# Patient Record
Sex: Female | Born: 1990 | Hispanic: Yes | Marital: Married | Smoking: Never smoker
Health system: Southern US, Community
[De-identification: ages and names within clinical notes are randomized; demographics above are authoritative.]

## PROBLEM LIST (undated history)

## (undated) DIAGNOSIS — B999 Unspecified infectious disease: Secondary | ICD-10-CM

## (undated) DIAGNOSIS — F32A Depression, unspecified: Secondary | ICD-10-CM

## (undated) DIAGNOSIS — D509 Iron deficiency anemia, unspecified: Secondary | ICD-10-CM

## (undated) DIAGNOSIS — D649 Anemia, unspecified: Secondary | ICD-10-CM

## (undated) DIAGNOSIS — F329 Major depressive disorder, single episode, unspecified: Secondary | ICD-10-CM

## (undated) HISTORY — DX: Iron deficiency anemia, unspecified: D50.9

## (undated) HISTORY — PX: NO PAST SURGERIES: SHX2092

## (undated) HISTORY — PX: APPENDECTOMY: SHX54

---
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
## 2016-08-11 ENCOUNTER — Emergency Department (HOSPITAL_BASED_OUTPATIENT_CLINIC_OR_DEPARTMENT_OTHER): Payer: Self-pay

## 2016-08-11 ENCOUNTER — Encounter (HOSPITAL_BASED_OUTPATIENT_CLINIC_OR_DEPARTMENT_OTHER): Payer: Self-pay | Admitting: Emergency Medicine

## 2016-08-11 ENCOUNTER — Emergency Department (HOSPITAL_BASED_OUTPATIENT_CLINIC_OR_DEPARTMENT_OTHER)
Admission: EM | Admit: 2016-08-11 | Discharge: 2016-08-11 | Disposition: A | Discharge status: Home / Self Care | Payer: Self-pay | Attending: Emergency Medicine | Admitting: Emergency Medicine

## 2016-08-11 DIAGNOSIS — N730 Acute parametritis and pelvic cellulitis: Secondary | ICD-10-CM

## 2016-08-11 DIAGNOSIS — N739 Female pelvic inflammatory disease, unspecified: Secondary | ICD-10-CM | POA: Insufficient documentation

## 2016-08-11 DIAGNOSIS — Z91010 Allergy to peanuts: Secondary | ICD-10-CM | POA: Insufficient documentation

## 2016-08-11 HISTORY — DX: Anemia, unspecified: D64.9

## 2016-08-11 LAB — URINALYSIS, ROUTINE W REFLEX MICROSCOPIC
Bilirubin Urine: NEGATIVE
GLUCOSE, UA: NEGATIVE mg/dL
HGB URINE DIPSTICK: NEGATIVE
KETONES UR: 15 mg/dL — AB
LEUKOCYTES UA: NEGATIVE
Nitrite: NEGATIVE
PH: 5.5 (ref 5.0–8.0)
Protein, ur: NEGATIVE mg/dL
Specific Gravity, Urine: 1.019 (ref 1.005–1.030)

## 2016-08-11 LAB — CBC WITH DIFFERENTIAL/PLATELET
Basophils Absolute: 0 10*3/uL (ref 0.0–0.1)
Basophils Relative: 0 %
Eosinophils Absolute: 0 10*3/uL (ref 0.0–0.7)
Eosinophils Relative: 1 %
HEMATOCRIT: 36.2 % (ref 36.0–46.0)
Hemoglobin: 12.1 g/dL (ref 12.0–15.0)
LYMPHS ABS: 1.7 10*3/uL (ref 0.7–4.0)
Lymphocytes Relative: 49 %
MCH: 26.4 pg (ref 26.0–34.0)
MCHC: 33.4 g/dL (ref 30.0–36.0)
MCV: 78.9 fL (ref 78.0–100.0)
MONO ABS: 0.4 10*3/uL (ref 0.1–1.0)
MONOS PCT: 12 %
Neutro Abs: 1.3 10*3/uL — ABNORMAL LOW (ref 1.7–7.7)
Neutrophils Relative %: 38 %
Platelets: 219 10*3/uL (ref 150–400)
RBC: 4.59 MIL/uL (ref 3.87–5.11)
RDW: 15.8 % — ABNORMAL HIGH (ref 11.5–15.5)
WBC: 3.4 10*3/uL — ABNORMAL LOW (ref 4.0–10.5)

## 2016-08-11 LAB — COMPREHENSIVE METABOLIC PANEL
ALK PHOS: 52 U/L (ref 38–126)
ALT: 13 U/L — ABNORMAL LOW (ref 14–54)
ANION GAP: 8 (ref 5–15)
AST: 22 U/L (ref 15–41)
Albumin: 4.7 g/dL (ref 3.5–5.0)
BILIRUBIN TOTAL: 0.8 mg/dL (ref 0.3–1.2)
BUN: 8 mg/dL (ref 6–20)
CALCIUM: 9.4 mg/dL (ref 8.9–10.3)
CO2: 23 mmol/L (ref 22–32)
Chloride: 106 mmol/L (ref 101–111)
Creatinine, Ser: 0.54 mg/dL (ref 0.44–1.00)
GFR calc non Af Amer: >60 mL/min (ref >60)
Glucose, Bld: 68 mg/dL (ref 65–99)
Potassium: 3.2 mmol/L — ABNORMAL LOW (ref 3.5–5.1)
Sodium: 137 mmol/L (ref 135–145)
TOTAL PROTEIN: 7.8 g/dL (ref 6.5–8.1)

## 2016-08-11 LAB — WET PREP, GENITAL
Clue Cells Wet Prep HPF POC: NONE SEEN
Sperm: NONE SEEN
Trich, Wet Prep: NONE SEEN
WBC, Wet Prep HPF POC: NONE SEEN
YEAST WET PREP: NONE SEEN

## 2016-08-11 LAB — PREGNANCY, URINE: Preg Test, Ur: NEGATIVE

## 2016-08-11 LAB — LIPASE, BLOOD: LIPASE: 34 U/L (ref 11–51)

## 2016-08-11 MED ORDER — DOXYCYCLINE HYCLATE 100 MG PO CAPS: 100.0000 mg | ORAL_CAPSULE | Freq: Two times a day (BID) | ORAL | 0 refills | Status: AC

## 2016-08-11 MED ORDER — KETOROLAC TROMETHAMINE 30 MG/ML IJ SOLN
15.0000 mg | Freq: Once | INTRAMUSCULAR | Status: AC
  Administered 2016-08-11: 15 mg via INTRAVENOUS
  Filled 2016-08-11: qty 1

## 2016-08-11 MED ORDER — GI COCKTAIL ~~LOC~~
30.0000 mL | Freq: Once | ORAL | Status: AC
  Administered 2016-08-11: 30 mL via ORAL
  Filled 2016-08-11: qty 30

## 2016-08-11 MED ORDER — ONDANSETRON 4 MG PO TBDP
4.0000 mg | ORAL_TABLET | Freq: Once | ORAL | Status: DC
Start: 2016-08-11 — End: 2016-08-11

## 2016-08-11 MED ORDER — AZITHROMYCIN 250 MG PO TABS
1000.0000 mg | ORAL_TABLET | Freq: Once | ORAL | Status: AC
  Administered 2016-08-11: 1000 mg via ORAL
  Filled 2016-08-11: qty 4

## 2016-08-11 MED ORDER — MORPHINE SULFATE (PF) 4 MG/ML IV SOLN
4.0000 mg | Freq: Once | INTRAVENOUS | Status: AC
  Administered 2016-08-11: 4 mg via INTRAVENOUS
  Filled 2016-08-11 (×2): qty 1

## 2016-08-11 MED ORDER — ONDANSETRON HCL 4 MG/2ML IJ SOLN
4.0000 mg | Freq: Once | INTRAMUSCULAR | Status: AC
  Administered 2016-08-11: 4 mg via INTRAVENOUS
  Filled 2016-08-11: qty 2

## 2016-08-11 MED ORDER — DOXYCYCLINE HYCLATE 100 MG PO CAPS: 100.0000 mg | ORAL_CAPSULE | Freq: Two times a day (BID) | ORAL | 0 refills | Status: DC

## 2016-08-11 MED ORDER — KETOROLAC TROMETHAMINE 60 MG/2ML IM SOLN: 15.0000 mg | Freq: Once | INTRAMUSCULAR | Status: DC

## 2016-08-11 MED ORDER — ACETAMINOPHEN 500 MG PO TABS
1000.0000 mg | ORAL_TABLET | Freq: Once | ORAL | Status: AC
  Administered 2016-08-11: 1000 mg via ORAL
  Filled 2016-08-11: qty 2

## 2016-08-11 MED ORDER — CEFTRIAXONE SODIUM 500 MG IJ SOLR
250.0000 mg | Freq: Once | INTRAMUSCULAR | Status: AC
  Administered 2016-08-11: 250 mg via INTRAVENOUS
  Filled 2016-08-11: qty 250

## 2016-08-11 MED ORDER — CEFTRIAXONE SODIUM 250 MG IJ SOLR
INTRAMUSCULAR | Status: AC
  Administered 2016-08-11: 16:00:00
  Filled 2016-08-11: qty 250

## 2016-08-11 NOTE — ED Provider Notes (Signed)
[facility]   CSN: [id number] Arrival date & time: 08/11/16  1319     History   Chief Complaint Chief Complaint  Patient presents with  . Flank Pain    HPI Lacey MulliganJosephlyne Davtyan is a 26 y.o. female.  26 yo F with a chief complaint of diffuse back pain. This is coming and going is a cramp. Been going on for 2 weeks. Slowly worsening. Has had pain similar to this when she had pyelonephritis. Denies fevers denies vomiting denies diarrhea or constipation. Denies dysuria. Denies vaginal bleeding or discharge.   The history is provided by the patient.  Flank Pain  This is a new problem. The current episode started more than 1 week ago. The problem occurs constantly. The problem has not changed since onset.Associated symptoms include abdominal pain. Pertinent negatives include no chest pain, no headaches and no shortness of breath. Nothing aggravates the symptoms. Nothing relieves the symptoms. She has tried nothing for the symptoms. The treatment provided no relief.    Past Medical History:  Diagnosis Date  . Anemia     There are no active problems to display for this patient.   Past Surgical History:  Procedure Laterality Date  . APPENDECTOMY      OB History    No data available       Home Medications    Prior to Admission medications   Medication Sig Start Date End Date Taking? Authorizing Provider  doxycycline (VIBRAMYCIN) 100 MG capsule Take 1 capsule (100 mg total) by mouth 2 (two) times daily. One po bid x 7 days 08/11/16 08/25/16  Melene PlanFloyd, Dyamon Sosinski, DO    Family History No family history on file.  Social History Social History  Substance Use Topics  . Smoking status: Never Smoker  . Smokeless tobacco: Never Used  . Alcohol use No     Allergies   Peanut-containing drug products   Review of Systems Review of Systems  Constitutional: Negative for chills and fever.  HENT: Negative for congestion and rhinorrhea.   Eyes: Negative for  redness and visual disturbance.  Respiratory: Negative for shortness of breath and wheezing.   Cardiovascular: Negative for chest pain and palpitations.  Gastrointestinal: Positive for abdominal pain. Negative for constipation, diarrhea, nausea and vomiting.  Genitourinary: Positive for flank pain. Negative for dysuria, pelvic pain, urgency, vaginal bleeding and vaginal discharge.  Musculoskeletal: Negative for arthralgias and myalgias.  Skin: Negative for pallor and wound.  Neurological: Negative for dizziness and headaches.     Physical Exam Updated Vital Signs BP 132/74 (BP Location: Left Arm)   Pulse 84   Temp 98.6 F (37 C) (Oral)   Resp 20   Wt 41.7 kg (92 lb)   LMP 08/09/2016 (Exact Date)   SpO2 98%   Physical Exam  Constitutional: She is oriented to person, place, and time. She appears well-developed and well-nourished. No distress.  HENT:  Head: Normocephalic and atraumatic.  Eyes: EOM are normal. Pupils are equal, round, and reactive to light.  Neck: Normal range of motion. Neck supple.  Cardiovascular: Normal rate and regular rhythm.  Exam reveals no gallop and no friction rub.   No murmur heard. Pulmonary/Chest: Effort normal. She has no wheezes. She has no rales.  Abdominal: Soft. She exhibits no distension and no mass. There is tenderness (diffuse). There is no guarding.  Genitourinary: Cervix exhibits motion tenderness and discharge (mucus). Right adnexum displays tenderness. Right adnexum displays no mass and no fullness. Left adnexum displays no  mass, no tenderness and no fullness.  Musculoskeletal: She exhibits no edema or tenderness.  Neurological: She is alert and oriented to person, place, and time.  Skin: Skin is warm and dry. She is not diaphoretic.  Psychiatric: She has a normal mood and affect. Her behavior is normal.  Nursing note and vitals reviewed.    ED Treatments / Results  Labs (all labs ordered are listed, but only abnormal results are  displayed) Labs Reviewed  URINALYSIS, ROUTINE W REFLEX MICROSCOPIC - Abnormal; Notable for the following:       Result Value   Ketones, ur 15 (*)    All other components within normal limits  CBC WITH DIFFERENTIAL/PLATELET - Abnormal; Notable for the following:    WBC 3.4 (*)    RDW 15.8 (*)    Neutro Abs 1.3 (*)    All other components within normal limits  COMPREHENSIVE METABOLIC PANEL - Abnormal; Notable for the following:    Potassium 3.2 (*)    ALT 13 (*)    All other components within normal limits  WET PREP, GENITAL  PREGNANCY, URINE  LIPASE, BLOOD  RPR  HIV ANTIBODY (ROUTINE TESTING)  GC/CHLAMYDIA PROBE AMP ([place]) NOT AT [facility]    EKG  EKG Interpretation None       Radiology No results found.  Procedures Procedures (including critical care time)  Medications Ordered in ED Medications  cefTRIAXone (ROCEPHIN) 250 mg in dextrose 5 % 50 mL IVPB (250 mg Intravenous New Bag/Given 08/11/16 1544)  acetaminophen (TYLENOL) tablet 1,000 mg (1,000 mg Oral Given 08/11/16 1401)  gi cocktail (Maalox,Lidocaine,Donnatal) (30 mLs Oral Given 08/11/16 1401)  ondansetron (ZOFRAN) injection 4 mg (4 mg Intravenous Given 08/11/16 1358)  ketorolac (TORADOL) 30 MG/ML injection 15 mg (15 mg Intravenous Given 08/11/16 1358)  morphine 4 MG/ML injection 4 mg (4 mg Intravenous Given 08/11/16 1544)  azithromycin (ZITHROMAX) tablet 1,000 mg (1,000 mg Oral Given 08/11/16 1544)  cefTRIAXone (ROCEPHIN) 250 MG injection (  Given 08/11/16 1552)     Initial Impression / Assessment and Plan / ED Course  I have reviewed the triage vital signs and the nursing notes.  Pertinent labs & imaging results that were available during my care of the patient were reviewed by me and considered in my medical decision making (see chart for details).     26 yo F With a chief complaint of 2 weeks of diffuse abdominal and back pain. Colicky in nature. No focal abdominal tenderness. No significant CVA tenderness.  UA is negative for infection. Will obtain labs. Duration of symptoms I feel a surgical cause of this is unlikely.   The patient on pelvic exam had cervical motion tenderness as well as some right adnexal pain. With her prolonged course of symptoms though obtain an ultrasound to evaluate for possible tubo-ovarian abscess.  The patients results and plan were reviewed and discussed.   Any x-rays performed were independently reviewed by myself.   Differential diagnosis were considered with the presenting HPI.  Medications  cefTRIAXone (ROCEPHIN) 250 mg in dextrose 5 % 50 mL IVPB (250 mg Intravenous New Bag/Given 08/11/16 1544)  acetaminophen (TYLENOL) tablet 1,000 mg (1,000 mg Oral Given 08/11/16 1401)  gi cocktail (Maalox,Lidocaine,Donnatal) (30 mLs Oral Given 08/11/16 1401)  ondansetron (ZOFRAN) injection 4 mg (4 mg Intravenous Given 08/11/16 1358)  ketorolac (TORADOL) 30 MG/ML injection 15 mg (15 mg Intravenous Given 08/11/16 1358)  morphine 4 MG/ML injection 4 mg (4 mg Intravenous Given 08/11/16 1544)  azithromycin (ZITHROMAX) tablet  1,000 mg (1,000 mg Oral Given 08/11/16 1544)  cefTRIAXone (ROCEPHIN) 250 MG injection (  Given 08/11/16 1552)    Vitals:   08/11/16 1324  BP: 132/74  Pulse: 84  Resp: 20  Temp: 98.6 F (37 C)  TempSrc: Oral  SpO2: 98%  Weight: 41.7 kg (92 lb)    Final diagnoses:  PID (acute pelvic inflammatory disease)     Final Clinical Impressions(s) / ED Diagnoses   Final diagnoses:  PID (acute pelvic inflammatory disease)    New Prescriptions Current Discharge Medication List    START taking these medications   Details  doxycycline (VIBRAMYCIN) 100 MG capsule Take 1 capsule (100 mg total) by mouth 2 (two) times daily. One po bid x 7 days Qty: 28 capsule, Refills: 0         Melene Plan, DO 08/11/16 1553

## 2016-08-11 NOTE — Discharge Instructions (Signed)
Try a miralax cleanout as this is also a possible cause of prolonged crampy abdominal pain.  A scoop in 8oz of water every day until you have significant bowel movements then stop.

## 2016-08-11 NOTE — ED Triage Notes (Signed)
Pt tearful at triage with bilateral flank pain. States she noticed it two weeks ago but pain continues to increase. Denies dysuria, fevers has intermettent nausea.

## 2016-08-11 NOTE — ED Notes (Signed)
ED Provider at bedside. 

## 2016-08-11 NOTE — ED Provider Notes (Signed)
Blood pressure 132/74, pulse 84, temperature 98.6 F (37 C), temperature source Oral, resp. rate 20, weight 41.7 kg (92 lb), last menstrual period 08/09/2016, SpO2 98 %.  Assuming care from Dr. Adela LankFloyd.  In short, Lacey Fischer is a 26 y.o. female with a chief complaint of Flank Pain .  Refer to the original H&P for additional details.   The current plan of care is to follow US to evaluate for TOA and treat for PID with CMT on exam if negative.   04:20 PM  US with no torsion or TOA. Patient given Ceftriaxone 250 mg IM once in the ED and discharged home with Doxy for 2 weeks. She will establish with PCP in the area.   At this time, I do not feel there is any life-threatening condition present. I have reviewed and discussed all results (EKG, imaging, lab, urine as appropriate), exam findings with patient. I have reviewed nursing notes and appropriate previous records.  I feel the patient is safe to be discharged home without further emergent workup. Discussed usual and customary return precautions. Patient and family (if present) verbalize understanding and are comfortable with this plan.  Patient will follow-up with their primary care provider. If they do not have a primary care provider, information for follow-up has been provided to them. All questions have been answered.  Alona BeneJoshua Long, MD    Maia PlanLong, Joshua G, MD 08/11/16 [phone]

## 2016-08-12 LAB — GC/CHLAMYDIA PROBE AMP (~~LOC~~) NOT AT ARMC
Chlamydia: NEGATIVE
Neisseria Gonorrhea: NEGATIVE

## 2016-08-12 LAB — HIV ANTIBODY (ROUTINE TESTING W REFLEX): HIV SCREEN 4TH GENERATION: NONREACTIVE

## 2016-08-12 LAB — RPR: RPR: NONREACTIVE

## 2016-09-20 ENCOUNTER — Emergency Department (HOSPITAL_COMMUNITY)
Admission: EM | Admit: 2016-09-20 | Discharge: 2016-09-21 | Disposition: A | Discharge status: Home / Self Care | Payer: No Typology Code available for payment source | Attending: Emergency Medicine | Admitting: Emergency Medicine

## 2016-09-20 ENCOUNTER — Encounter (HOSPITAL_COMMUNITY): Payer: Self-pay | Admitting: Emergency Medicine

## 2016-09-20 DIAGNOSIS — Z91010 Allergy to peanuts: Secondary | ICD-10-CM | POA: Diagnosis not present

## 2016-09-20 DIAGNOSIS — T782XXA Anaphylactic shock, unspecified, initial encounter: Secondary | ICD-10-CM | POA: Diagnosis not present

## 2016-09-20 DIAGNOSIS — Z79899 Other long term (current) drug therapy: Secondary | ICD-10-CM | POA: Insufficient documentation

## 2016-09-20 DIAGNOSIS — T7840XA Allergy, unspecified, initial encounter: Secondary | ICD-10-CM | POA: Diagnosis present

## 2016-09-20 MED ORDER — METHYLPREDNISOLONE SODIUM SUCC 125 MG IJ SOLR
125.0000 mg | Freq: Once | INTRAMUSCULAR | Status: DC
  Filled 2016-09-20: qty 2

## 2016-09-20 MED ORDER — SODIUM CHLORIDE 0.9 % IV BOLUS (SEPSIS)
1000.0000 mL | Freq: Once | INTRAVENOUS | Status: AC
  Administered 2016-09-20: 1000 mL via INTRAVENOUS

## 2016-09-20 MED ORDER — FAMOTIDINE IN NACL 20-0.9 MG/50ML-% IV SOLN
20.0000 mg | Freq: Once | INTRAVENOUS | Status: AC
  Administered 2016-09-20: 20 mg via INTRAVENOUS
  Filled 2016-09-20: qty 50

## 2016-09-20 NOTE — ED Triage Notes (Signed)
Per EMS pt coming in with allergic reaction to nuts. Pt accidentally ate ice cream with nuts. Pt having swelling to face, mouth, and throat. Pt has bilateral wheezing upon arrival. Pt received 600mg  fluid prior to arrival. Pt was given 1 epi pen and 50mg  of Benedryl prior to EMS arrival to scene. EMS gave another epi pen and 5 albuterol, duoneb, and 125 of solumedrol. Pt denies any LOC. No hives.  18g R AC  113/67 HR 112 100% on neb treatment (wheezes still present with dry cough)

## 2016-09-20 NOTE — ED Notes (Signed)
Bed: WA17 Expected date:  Expected time:  Means of arrival:  Comments: EMS allergic reaction/epi pen and epi 0.3 mg per EMS/oral swelling after eating ice cream-nut allergy-also given solumedrol and albuterol

## 2016-09-20 NOTE — ED Provider Notes (Signed)
[facility]-EMER[facility]Y DEPT Provider Note   CSN: [id number] Arrival date & time: 09/20/16  2242     History   Chief Complaint Chief Complaint  Patient presents with  . Allergic Reaction    HPI Lacey Fischer is a 26 y.o. female.  26 year old female with a history of anemia presents to the emergency department for evaluation of allergic reaction. Patient with allergy to nuts and accidentally ate ice cream that contained nuts. She reports sensation of throat swelling as well as swelling to her face and mouth. EMS noted bilateral wheezing upon arrival. Patient had tried to self administer an EpiPen, but this was expired in 2014. EMS gave additional EpiPen as well as 50 mg Benadryl. Patient also received 125 mg Solu-Medrol and an albuterol treatment. The patient states that she is currently feeling much better. She denies any difficulty swallowing or shortness of breath currently.   The history is provided by the patient. No language interpreter was used.  Allergic Reaction    Past Medical History:  Diagnosis Date  . Anemia     There are no active problems to display for this patient.   Past Surgical History:  Procedure Laterality Date  . APPENDECTOMY      OB History    No data available       Home Medications    Prior to Admission medications   Medication Sig Start Date End Date Taking? Authorizing Provider  IRON PO Take 1 tablet by mouth daily.   Yes [provider]  EPINEPHrine (EPIPEN 2-PAK) 0.3 mg/0.3 mL IJ SOAJ injection Inject 0.3 mLs (0.3 mg total) into the muscle once as needed (for severe allergic reaction). CAll 911 immediately if you have to use this medicine 09/21/16   Antony Madura, PA-C    Family History No family history on file.  Social History Social History  Substance Use Topics  . Smoking status: Never Smoker  . Smokeless tobacco: Never Used  . Alcohol use No     Allergies   Peanut-containing drug products   Review of  Systems Review of Systems Ten systems reviewed and are negative for acute change, except as noted in the HPI.    Physical Exam Updated Vital Signs BP 108/70   Pulse 80   Temp 98.3 F (36.8 C) (Oral)   Resp 19   LMP 09/03/2016   SpO2 99%   Physical Exam  Constitutional: She is oriented to person, place, and time. She appears well-developed and well-nourished. No distress.  Calm and cooperative  HENT:  Head: Normocephalic and atraumatic.  Raspy voice. Oropharynx clear. Patient tolerating secretions without difficulty.  Eyes: Conjunctivae and EOM are normal. No scleral icterus.  Neck: Normal range of motion.  No stridor  Cardiovascular: Regular rhythm and intact distal pulses.   Tachycardia  Pulmonary/Chest: Effort normal. No respiratory distress. She has no rales.  Respirations even and unlabored.  Musculoskeletal: Normal range of motion.  Neurological: She is alert and oriented to person, place, and time. She exhibits normal muscle tone. Coordination normal.  GCS 15. Patient moving all extremities.  Skin: Skin is warm and dry. No rash noted. She is not diaphoretic. No erythema. No pallor.  Psychiatric: She has a normal mood and affect. Her behavior is normal.  Nursing note and vitals reviewed.    ED Treatments / Results  Labs (all labs ordered are listed, but only abnormal results are displayed) Labs Reviewed - No data to display  EKG  EKG Interpretation None  Radiology No results found.  Procedures Procedures (including critical care time)  Medications Ordered in ED Medications  famotidine (PEPCID) IVPB 20 mg premix (0 mg Intravenous Stopped 09/20/16 2334)  sodium chloride 0.9 % bolus 1,000 mL (0 mLs Intravenous Stopped 09/20/16 2341)     Initial Impression / Assessment and Plan / ED Course  I have reviewed the triage vital signs and the nursing notes.  Pertinent labs & imaging results that were available during my care of the patient were reviewed  by me and considered in my medical decision making (see chart for details).     26 year old female presents to the emergency department for anaphylactic reaction after accidentally eating ice cream with nuts. Patient with known allergy to nuts. She was given Solu-Medrol as well as Benadryl and an EpiPen by EMS. EMS also administered a DuoNeb. Patient states that she is feeling much better, though her voice was still raspy on initial presentation.  The patient has an monitored for 4 hours. On reassessment, she states that she is feeling much better. Voice is much clearer. She has no complaint of difficulty swallowing or shortness of breath. Vitals stable. I do not see indication for further emergent workup or monitoring. Will discharge with prescription for EpiPen. Return precautions discussed and provided. Patient discharged in stable condition.   Vitals:   09/21/16 0230 09/21/16 0245 09/21/16 0300 09/21/16 0315  BP: 114/73 108/71 106/65 108/70  Pulse: 93 (!) 107 88 80  Resp: 18 (!) 25 (!) 22 19  Temp:      TempSrc:      SpO2: 99% 100% 99% 99%    Final Clinical Impressions(s) / ED Diagnoses   Final diagnoses:  Anaphylaxis, initial encounter    New Prescriptions New Prescriptions   EPINEPHRINE (EPIPEN 2-PAK) 0.3 MG/0.3 ML IJ SOAJ INJECTION    Inject 0.3 mLs (0.3 mg total) into the muscle once as needed (for severe allergic reaction). CAll 911 immediately if you have to use this medicine     Antony MaduraHumes, Monick Rena, PA-C 09/21/16 0328    Paula LibraMolpus, John, MD 09/21/16 [phone]

## 2016-09-21 MED ORDER — EPINEPHRINE 0.3 MG/0.3ML IJ SOAJ: 0.3000 mg | Freq: Once | INTRAMUSCULAR | 1 refills | Status: AC | PRN

## 2019-01-17 IMAGING — US US TRANSVAGINAL NON-OB
1 series · 13 of 25 positions shown · non-contrast
Comparison: None.

CLINICAL DATA: Right adnexal pain for 1 week. Negative pregnancy
test.

EXAM:
TRANSABDOMINAL AND TRANSVAGINAL ULTRASOUND OF PELVIS
DOPPLER ULTRASOUND OF OVARIES
TECHNIQUE: Both transabdominal and transvaginal ultrasound examinations of the
pelvis were performed. Transabdominal technique was performed for
global imaging of the pelvis including uterus, ovaries, adnexal
regions, and pelvic cul-de-sac.
It was necessary to proceed with endovaginal exam following the
transabdominal exam to visualize the uterus and ovaries in better
detail. Color and duplex Doppler ultrasound was utilized to evaluate
blood flow to the ovaries.

[Series 1: us transvaginal non-ob · 0.18mm/px · 13 of 86 slices shown]
[im 1/86]
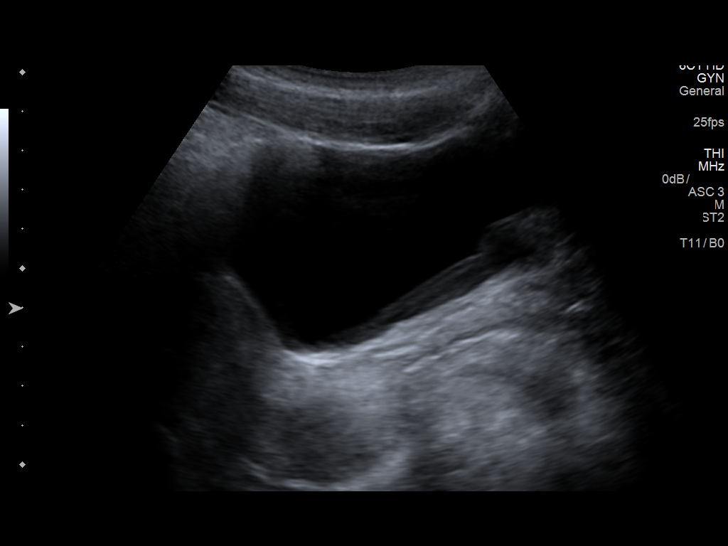
[im 8/86]
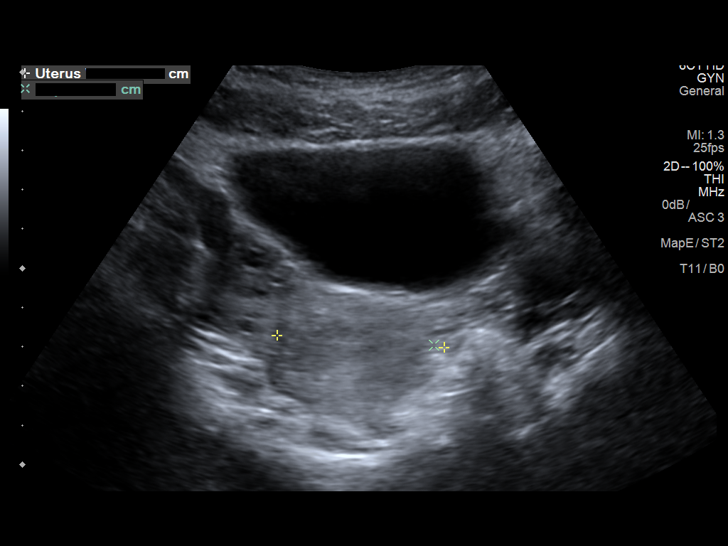
[im 15/86]
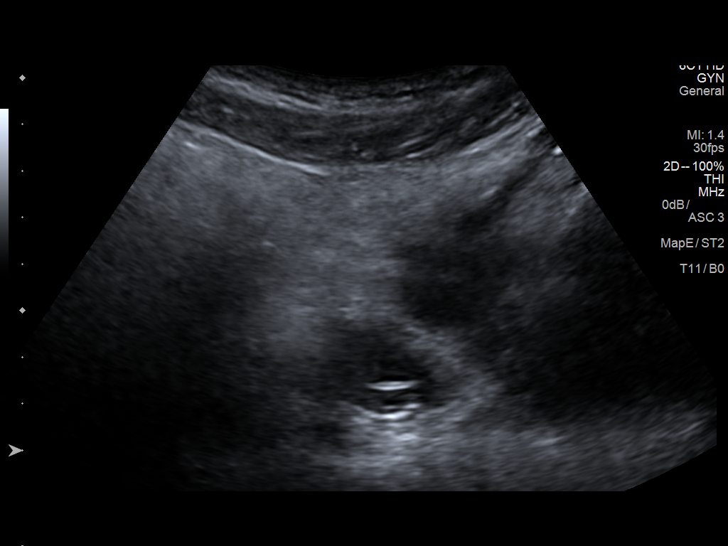
[im 22/86]
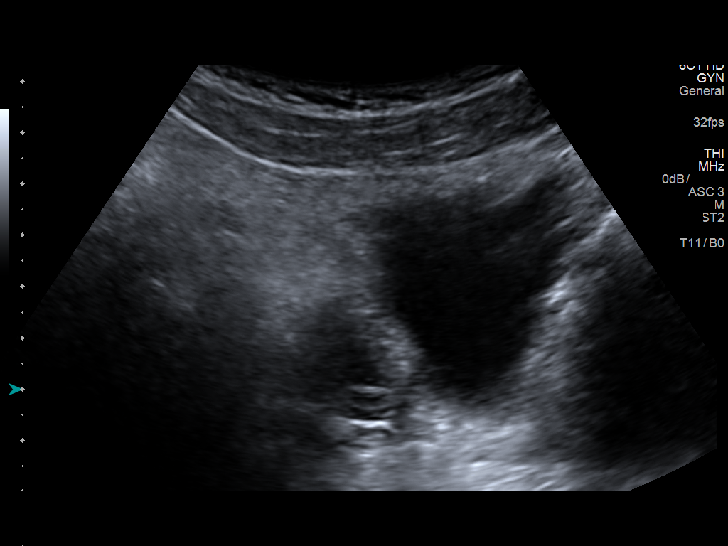
[im 29/86]
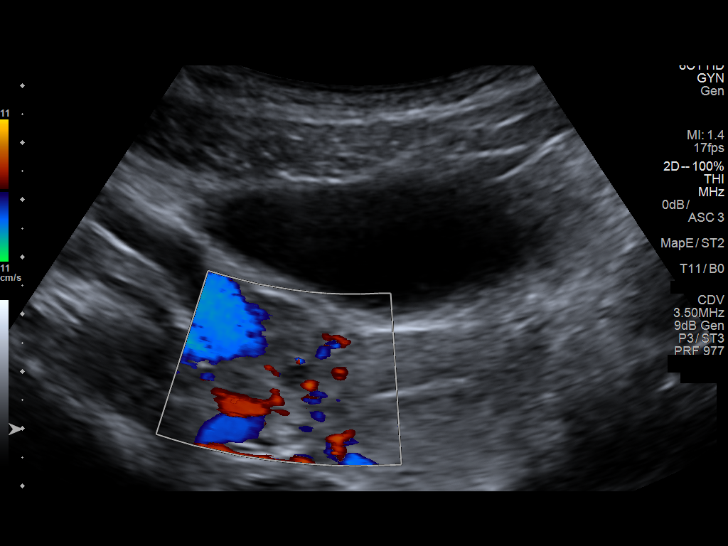
[im 36/86]
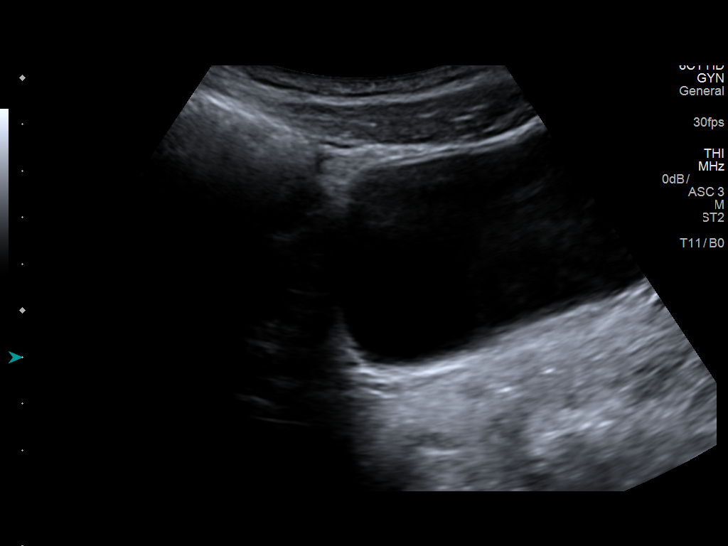
[im 43/86]
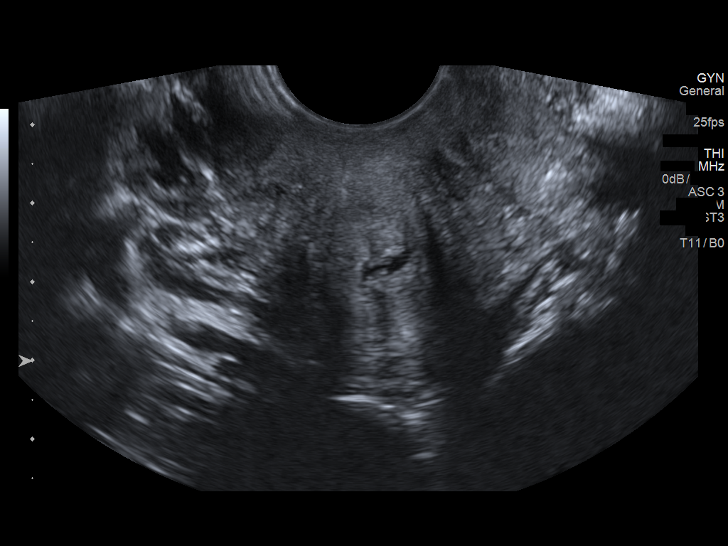
[im 50/86]
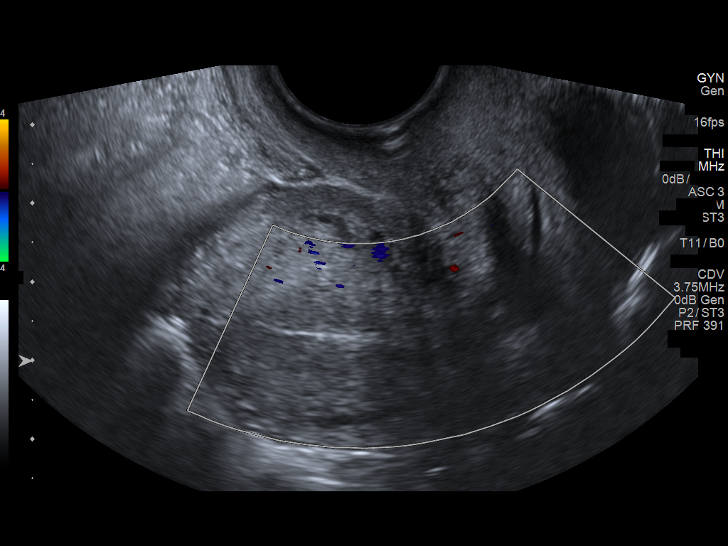
[im 57/86]
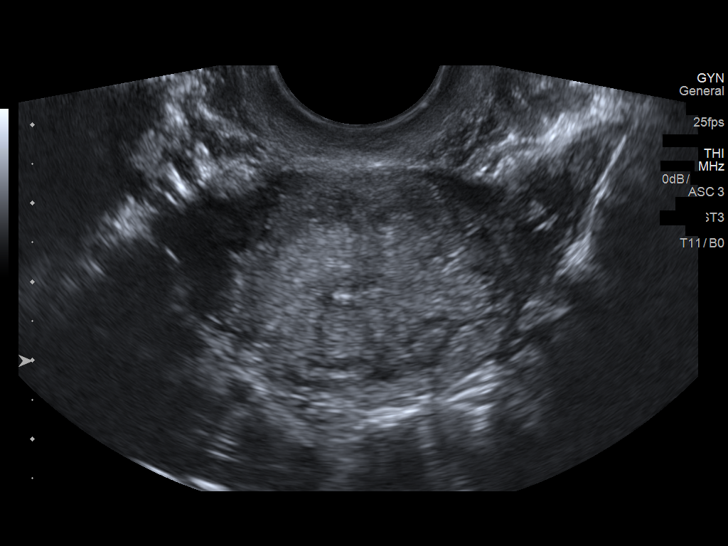
[im 64/86]
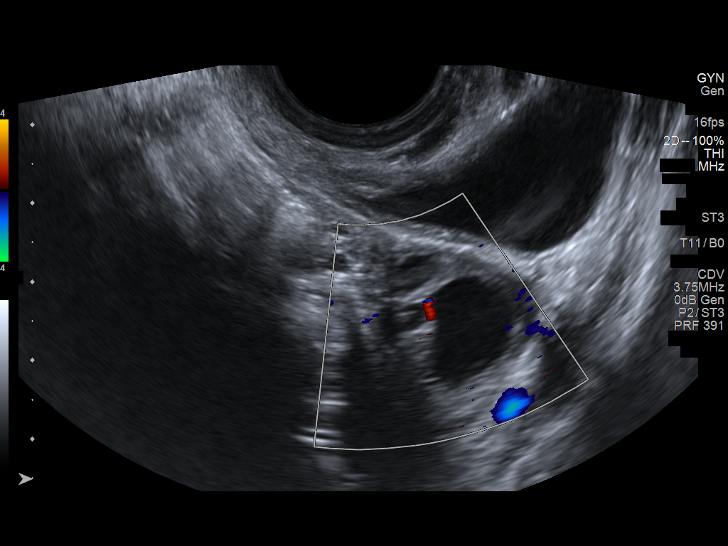
[im 71/86]
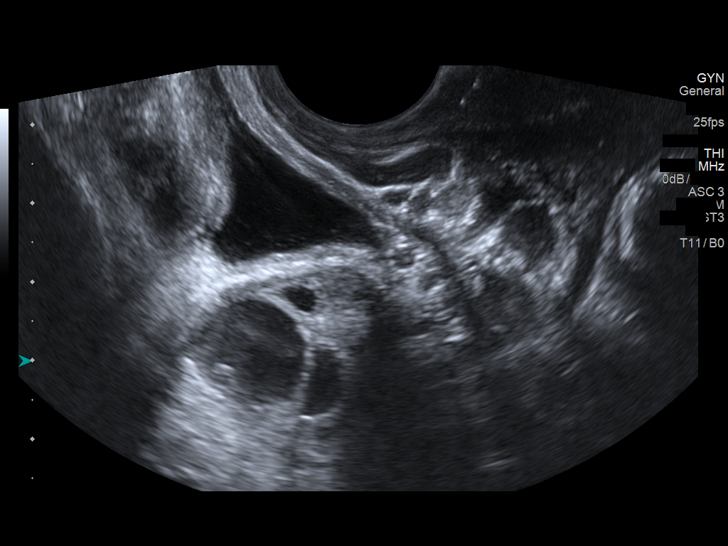
[im 78/86]
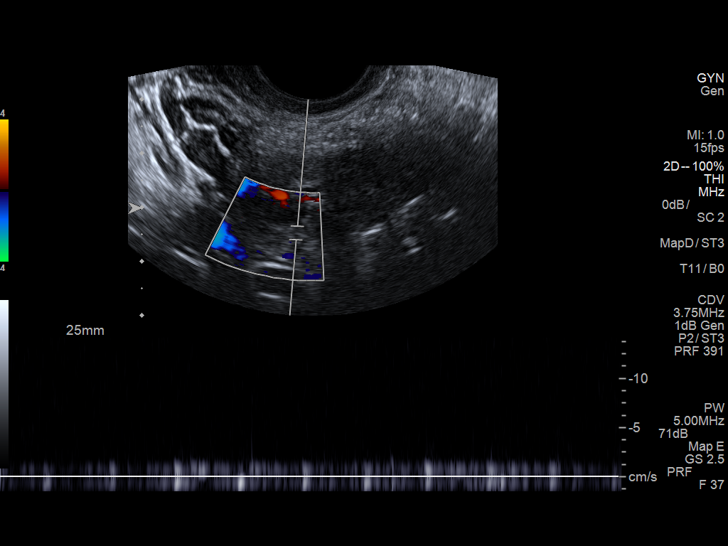
[im 86/86]
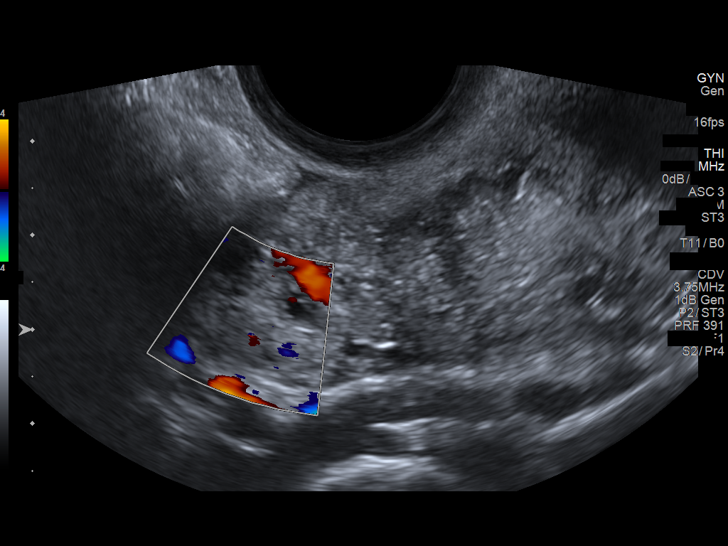

[13 of 25 positions shown; findings below may reference images not displayed]

FINDINGS: Uterus

Measurements: 6.7 x 3.9 x 3.4 cm. No fibroids or other mass
visualized.

Endometrium

Thickness: 6.8 mm.  No focal abnormality visualized.

Right ovary

Measurements: 2.5 x 1.6 x 1.4 cm. Normal appearance/no adnexal mass.

Left ovary

Measurements: 2.9 x 2.4 x 2.0 cm. Normal appearance/no adnexal mass.

Pulsed Doppler evaluation of both ovaries demonstrates normal
low-resistance arterial and venous waveforms.

Other findings

No abnormal free fluid.
IMPRESSION: Normal examination. No evidence of ovarian torsion or tubo-ovarian
abscess.

## 2019-05-01 ENCOUNTER — Ambulatory Visit: Payer: Self-pay

## 2019-07-01 ENCOUNTER — Other Ambulatory Visit: Payer: Self-pay

## 2019-07-01 ENCOUNTER — Encounter (HOSPITAL_COMMUNITY): Payer: Self-pay | Admitting: Obstetrics & Gynecology

## 2019-07-01 ENCOUNTER — Inpatient Hospital Stay (HOSPITAL_COMMUNITY)
Admission: AD | Admit: 2019-07-01 | Discharge: 2019-07-01 | Disposition: A | Discharge status: Home / Self Care | Payer: Medicaid Other | Attending: Obstetrics & Gynecology | Admitting: Obstetrics & Gynecology

## 2019-07-01 DIAGNOSIS — O210 Mild hyperemesis gravidarum: Secondary | ICD-10-CM | POA: Diagnosis present

## 2019-07-01 DIAGNOSIS — Z3A10 10 weeks gestation of pregnancy: Secondary | ICD-10-CM | POA: Diagnosis not present

## 2019-07-01 HISTORY — DX: Unspecified infectious disease: B99.9

## 2019-07-01 HISTORY — DX: Depression, unspecified: F32.A

## 2019-07-01 LAB — COMPREHENSIVE METABOLIC PANEL
ALT: 18 U/L (ref 0–44)
AST: 22 U/L (ref 15–41)
Albumin: 4.1 g/dL (ref 3.5–5.0)
Alkaline Phosphatase: 52 U/L (ref 38–126)
Anion gap: 11 (ref 5–15)
BUN: 10 mg/dL (ref 6–20)
CO2: 22 mmol/L (ref 22–32)
Calcium: 9.1 mg/dL (ref 8.9–10.3)
Chloride: 103 mmol/L (ref 98–111)
Creatinine, Ser: 0.44 mg/dL (ref 0.44–1.00)
GFR calc Af Amer: >60 mL/min (ref >60)
GFR calc non Af Amer: >60 mL/min (ref >60)
Glucose, Bld: 89 mg/dL (ref 70–99)
Potassium: 3.6 mmol/L (ref 3.5–5.1)
Sodium: 136 mmol/L (ref 135–145)
Total Bilirubin: 0.7 mg/dL (ref 0.3–1.2)
Total Protein: 7.2 g/dL (ref 6.5–8.1)

## 2019-07-01 LAB — URINALYSIS, ROUTINE W REFLEX MICROSCOPIC
Bilirubin Urine: NEGATIVE
Glucose, UA: NEGATIVE mg/dL
Hgb urine dipstick: NEGATIVE
Ketones, ur: NEGATIVE mg/dL
Leukocytes,Ua: NEGATIVE
Nitrite: NEGATIVE
Protein, ur: NEGATIVE mg/dL
Specific Gravity, Urine: 1.026 (ref 1.005–1.030)
pH: 5 (ref 5.0–8.0)

## 2019-07-01 MED ORDER — ONDANSETRON 4 MG PO TBDP
8.0000 mg | ORAL_TABLET | Freq: Once | ORAL | Status: AC
  Administered 2019-07-01: 12:00:00 8 mg via ORAL
  Filled 2019-07-01: qty 2

## 2019-07-01 MED ORDER — LACTATED RINGERS IV BOLUS
1000.0000 mL | Freq: Once | INTRAVENOUS | Status: AC
  Administered 2019-07-01: 1000 mL via INTRAVENOUS

## 2019-07-01 MED ORDER — ONDANSETRON HCL 8 MG PO TABS
8.0000 mg | ORAL_TABLET | Freq: Three times a day (TID) | ORAL | 0 refills | Status: DC | PRN
Start: 2019-07-01 — End: 2022-06-14

## 2019-07-01 NOTE — Discharge Instructions (Signed)

## 2019-07-01 NOTE — MA[facility] Provider Note (Signed)
Patient Lacey Fischer is a 29 y.o. G2P0010  at [redacted]w[redacted]d here with complaints of nausea and vomiting and feeling dizzy. She denies vaginal bleeding, vaginal discharge, dysuria, fever, SOB. She denies any other complaints. She has some stomach cramping from throwing up.   She says she threw up once at 3 am, and then threw up again when she got to work. She ate a normal diet yesterday but could not keep food down today.   History     CSN: [id number]  Arrival date and time: 07/01/19 1013   First Provider Initiated Contact with Patient 07/01/19 1138      Chief Complaint  Patient presents with  . Abdominal Pain  . Emesis  . Nausea  . Dizziness   Emesis  This is a new problem. The current episode started yesterday. The problem occurs 2 to 4 times per day. The problem has been unchanged. The emesis has an appearance of stomach contents. There has been no fever. Associated symptoms include dizziness. Pertinent negatives include no chest pain or coughing.  Dizziness This is a new problem. The problem occurs rarely. The problem has been resolved. Associated symptoms include vomiting. Pertinent negatives include no chest pain or coughing.    OB History    Gravida  2   Para      Term      Preterm      AB  1   Living  0     SAB  1   TAB      Ectopic      Multiple      Live Births  0           Past Medical History:  Diagnosis Date  . Anemia   . Depression    in therapy, doing good  . Infection    UTI    Past Surgical History:  Procedure Laterality Date  . APPENDECTOMY    . NO PAST SURGERIES      Family History  Problem Relation Age of Onset  . Healthy Mother   . Healthy Father     Social History   Tobacco Use  . Smoking status: Never Smoker  . Smokeless tobacco: Never Used  Substance Use Topics  . Alcohol use: No  . Drug use: No    Allergies:  Allergies  Allergen Reactions  . Peanut-Containing Drug Products Anaphylaxis, Swelling and Rash     Medications Prior to Admission  Medication Sig Dispense Refill Last Dose  . prenatal vitamin w/FE, FA (PRENATAL 1 + 1) 27-1 MG TABS tablet Take 1 tablet by mouth daily at 12 noon.     Marland Kitchen EPINEPHrine (EPIPEN 2-PAK) 0.3 mg/0.3 mL IJ SOAJ injection Inject 0.3 mLs (0.3 mg total) into the muscle once as needed (for severe allergic reaction). CAll 911 immediately if you have to use this medicine 1 Device 1 More than a month at Unknown time  . IRON PO Take 1 tablet by mouth daily.       Review of Systems  Constitutional: Negative.   HENT: Negative.   Respiratory: Negative.  Negative for cough.   Cardiovascular: Negative for chest pain.  Gastrointestinal: Positive for vomiting.  Skin: Negative.   Neurological: Positive for dizziness.   Physical Exam   Blood pressure 116/72, pulse (!) 110, temperature 99.2 F (37.3 C), temperature source Oral, resp. rate 16, height 5' (1.524 m), weight 44.6 kg, last menstrual period 04/19/2019, SpO2 100 %.  Physical Exam  Constitutional: She appears well-developed.  HENT:  Head: Normocephalic.  Respiratory: Effort normal.  GI: Soft. Bowel sounds are normal.  Musculoskeletal:        General: Normal range of motion.     Cervical back: Normal range of motion.  Neurological: She is alert.  Skin: Skin is warm and dry.    MAU Course  Procedures  MDM -UA normal, no other labs done -Patient had IV fluids and Zofran, feels much better.  -FHR is 175 by doppler.   Assessment and Plan   1. Morning sickness    2. Patient stable for discharge with recommendation to take RX for Zofran PRN and try light foods.   3. Keep follow up OB visit; return to MAU if cannot keep down fluids.  Return if condition worsens or develops new ob-gyn complaint.   Mervyn Skeeters Jshaun Abernathy 07/01/2019, 1:32 PM

## 2019-07-01 NOTE — MAU Note (Addendum)
Woke up about 3 in the morning throwing up.  Got sick on the way to work and it has continued.  Started feeling really dizzy, almost passed out.  Tried to eat a little watermelon, is not tolerating anything, now stomach is cramping. [facility]

## 2019-10-29 ENCOUNTER — Other Ambulatory Visit: Payer: Self-pay

## 2020-02-24 DIAGNOSIS — F322 Major depressive disorder, single episode, severe without psychotic features: Secondary | ICD-10-CM | POA: Insufficient documentation

## 2022-05-14 DIAGNOSIS — D509 Iron deficiency anemia, unspecified: Secondary | ICD-10-CM | POA: Insufficient documentation

## 2022-05-14 DIAGNOSIS — R2240 Localized swelling, mass and lump, unspecified lower limb: Secondary | ICD-10-CM | POA: Insufficient documentation

## 2022-05-21 ENCOUNTER — Other Ambulatory Visit (HOSPITAL_BASED_OUTPATIENT_CLINIC_OR_DEPARTMENT_OTHER): Payer: Self-pay | Admitting: Nurse Practitioner

## 2022-05-21 DIAGNOSIS — R2242 Localized swelling, mass and lump, left lower limb: Secondary | ICD-10-CM

## 2022-05-23 ENCOUNTER — Ambulatory Visit (HOSPITAL_BASED_OUTPATIENT_CLINIC_OR_DEPARTMENT_OTHER)
Admission: RE | Admit: 2022-05-23 | Discharge: 2022-05-23 | Disposition: A | Discharge status: Home / Self Care | Payer: Medicaid Other | Source: Ambulatory Visit | Attending: Nurse Practitioner | Admitting: Nurse Practitioner

## 2022-05-23 DIAGNOSIS — R2242 Localized swelling, mass and lump, left lower limb: Secondary | ICD-10-CM | POA: Diagnosis present

## 2022-05-24 ENCOUNTER — Telehealth: Payer: Self-pay | Admitting: Hematology and Oncology

## 2022-05-24 NOTE — Telephone Encounter (Signed)
scheduled per 3/22 referral, pt has been called and confirmed date and time. Pt is aware of location and to arrive early for check in

## 2022-05-29 ENCOUNTER — Encounter: Payer: Self-pay | Admitting: Gastroenterology

## 2022-06-08 ENCOUNTER — Emergency Department (HOSPITAL_COMMUNITY): Payer: Self-pay

## 2022-06-08 ENCOUNTER — Encounter (HOSPITAL_COMMUNITY): Payer: Self-pay

## 2022-06-08 ENCOUNTER — Emergency Department (HOSPITAL_COMMUNITY)
Admission: EM | Admit: 2022-06-08 | Discharge: 2022-06-08 | Disposition: A | Discharge status: Home / Self Care | Payer: Self-pay | Attending: Emergency Medicine | Admitting: Emergency Medicine

## 2022-06-08 ENCOUNTER — Other Ambulatory Visit: Payer: Self-pay

## 2022-06-08 DIAGNOSIS — R109 Unspecified abdominal pain: Secondary | ICD-10-CM

## 2022-06-08 DIAGNOSIS — K279 Peptic ulcer, site unspecified, unspecified as acute or chronic, without hemorrhage or perforation: Secondary | ICD-10-CM | POA: Insufficient documentation

## 2022-06-08 DIAGNOSIS — Z91010 Allergy to peanuts: Secondary | ICD-10-CM | POA: Insufficient documentation

## 2022-06-08 LAB — CBC
HCT: 35.6 % — ABNORMAL LOW (ref 36.0–46.0)
Hemoglobin: 10.8 g/dL — ABNORMAL LOW (ref 12.0–15.0)
MCH: 22.2 pg — ABNORMAL LOW (ref 26.0–34.0)
MCHC: 30.3 g/dL (ref 30.0–36.0)
MCV: 73.3 fL — ABNORMAL LOW (ref 80.0–100.0)
Platelets: 339 10*3/uL (ref 150–400)
RBC: 4.86 MIL/uL (ref 3.87–5.11)
RDW: 17.4 % — ABNORMAL HIGH (ref 11.5–15.5)
WBC: 4.2 10*3/uL (ref 4.0–10.5)
nRBC: 0 % (ref 0.0–0.2)

## 2022-06-08 LAB — HEPATIC FUNCTION PANEL
ALT: 26 U/L (ref 0–44)
AST: 25 U/L (ref 15–41)
Albumin: 4.4 g/dL (ref 3.5–5.0)
Alkaline Phosphatase: 53 U/L (ref 38–126)
Bilirubin, Direct: <0.1 mg/dL (ref 0.0–0.2)
Total Bilirubin: 0.5 mg/dL (ref 0.3–1.2)
Total Protein: 8.2 g/dL — ABNORMAL HIGH (ref 6.5–8.1)

## 2022-06-08 LAB — BASIC METABOLIC PANEL
Anion gap: 9 (ref 5–15)
BUN: 9 mg/dL (ref 6–20)
CO2: 21 mmol/L — ABNORMAL LOW (ref 22–32)
Calcium: 9.5 mg/dL (ref 8.9–10.3)
Chloride: 106 mmol/L (ref 98–111)
Creatinine, Ser: 0.55 mg/dL (ref 0.44–1.00)
GFR, Estimated: >60 mL/min (ref >60)
Glucose, Bld: 86 mg/dL (ref 70–99)
Potassium: 3.3 mmol/L — ABNORMAL LOW (ref 3.5–5.1)
Sodium: 136 mmol/L (ref 135–145)

## 2022-06-08 LAB — LIPASE, BLOOD: Lipase: 43 U/L (ref 11–51)

## 2022-06-08 LAB — C-REACTIVE PROTEIN: CRP: 0.8 mg/dL (ref <1.0)

## 2022-06-08 LAB — I-STAT BETA HCG BLOOD, ED (MC, WL, AP ONLY): I-stat hCG, quantitative: <5 m[IU]/mL (ref <5)

## 2022-06-08 LAB — TROPONIN I (HIGH SENSITIVITY)
Troponin I (High Sensitivity): <2 ng/L (ref <18)
Troponin I (High Sensitivity): <2 ng/L (ref <18)

## 2022-06-08 LAB — SEDIMENTATION RATE: Sed Rate: 7 mm/hr (ref 0–22)

## 2022-06-08 MED ORDER — FAMOTIDINE 20 MG PO TABS: 40.0000 mg | ORAL_TABLET | Freq: Every day | ORAL | 1 refills | Status: AC

## 2022-06-08 MED ORDER — MORPHINE SULFATE (PF) 4 MG/ML IV SOLN
4.0000 mg | Freq: Once | INTRAVENOUS | Status: AC
  Administered 2022-06-08: 4 mg via INTRAVENOUS
  Filled 2022-06-08: qty 1

## 2022-06-08 MED ORDER — POTASSIUM CHLORIDE CRYS ER 20 MEQ PO TBCR
40.0000 meq | EXTENDED_RELEASE_TABLET | Freq: Once | ORAL | Status: AC
  Administered 2022-06-08: 40 meq via ORAL
  Filled 2022-06-08: qty 2

## 2022-06-08 MED ORDER — MAALOX MAX 400-400-40 MG/5ML PO SUSP: 10.0000 mL | Freq: Four times a day (QID) | ORAL | 0 refills | Status: AC | PRN

## 2022-06-08 MED ORDER — ALUM & MAG HYDROXIDE-SIMETH 200-200-20 MG/5ML PO SUSP
30.0000 mL | Freq: Once | ORAL | Status: AC
  Administered 2022-06-08: 30 mL via ORAL
  Filled 2022-06-08: qty 30

## 2022-06-08 MED ORDER — ONDANSETRON HCL 4 MG/2ML IJ SOLN
4.0000 mg | Freq: Once | INTRAMUSCULAR | Status: AC
  Administered 2022-06-08: 4 mg via INTRAVENOUS
  Filled 2022-06-08: qty 2

## 2022-06-08 MED ORDER — IOHEXOL 300 MG/ML  SOLN
100.0000 mL | Freq: Once | INTRAMUSCULAR | Status: AC | PRN
  Administered 2022-06-08: 100 mL via INTRAVENOUS

## 2022-06-08 MED ORDER — LACTATED RINGERS IV BOLUS
1000.0000 mL | Freq: Once | INTRAVENOUS | Status: AC
  Administered 2022-06-08: 1000 mL via INTRAVENOUS

## 2022-06-08 MED ORDER — FAMOTIDINE IN NACL 20-0.9 MG/50ML-% IV SOLN
20.0000 mg | Freq: Once | INTRAVENOUS | Status: AC
  Administered 2022-06-08: 20 mg via INTRAVENOUS
  Filled 2022-06-08: qty 50

## 2022-06-08 MED ORDER — LIDOCAINE VISCOUS HCL 2 % MT SOLN
15.0000 mL | Freq: Once | OROMUCOSAL | Status: AC
  Administered 2022-06-08: 15 mL via ORAL
  Filled 2022-06-08: qty 15

## 2022-06-08 NOTE — Discharge Instructions (Signed)
You were seen for your stomach pain in the emergency department. It is likely from an ulcer in your stomach.   At home, please take the Pepcid we have prescribed you to treat your ulcer.  You may use the Maalox we have prescribed you as needed as well.  Please avoid alcohol and NSAIDs such as ibuprofen as this may worsen your condition.  Check your MyChart online for the results of any tests that had not resulted by the time you left the emergency department.   Follow-up with your primary doctor in 2-3 days regarding your visit.    Return immediately to the emergency department if you experience any of the following: Severe pain, fevers, or any other concerning symptoms.    Thank you for visiting our Emergency Department. It was a pleasure taking care of you today.

## 2022-06-08 NOTE — ED Provider Notes (Signed)
Lacey Fischer Provider Note   CSN: [id number] Arrival date & time: 06/08/22  1402     History  Chief Complaint  Patient presents with   Abdominal Pain    Lacey Fischer is a 32 y.o. female.   32 year old female undergoing a workup for Crohn's disease presents emergency department with abdominal pain.  Since yesterday has had periumbilical abdominal pain.  Says that it is a cramping-like sensation that started after she had a milkshake.  Says it has been persistent and then had brunch today which made it worse.  Radiates to her back and up to her chest.  Denies any fevers, vomiting, diarrhea.  Has had some nausea and constipation.  Is not on any medications for her Crohn's disease currently.  Has had appendectomy but denies any other abdominal surgeries.  No heavy alcohol intake or history of pancreatitis.       Home Medications Prior to Admission medications   Medication Sig Start Date End Date Taking? Authorizing Provider  alum & mag hydroxide-simeth (MAALOX MAX) 400-400-40 MG/5ML suspension Take 10 mLs by mouth every 6 (six) hours as needed for indigestion. 06/08/22  Yes Rondel Baton, MD  famotidine (PEPCID) 20 MG tablet Take 2 tablets (40 mg total) by mouth at bedtime. 06/08/22 07/08/22 Yes Rondel Baton, MD  EPINEPHrine (EPIPEN 2-PAK) 0.3 mg/0.3 mL IJ SOAJ injection Inject 0.3 mLs (0.3 mg total) into the muscle once as needed (for severe allergic reaction). CAll 911 immediately if you have to use this medicine 09/21/16   Antony Madura, PA-C  IRON PO Take 1 tablet by mouth daily.    [provider]  ondansetron (ZOFRAN) 8 MG tablet Take 1 tablet (8 mg total) by mouth every 8 (eight) hours as needed for nausea or vomiting. 07/01/19   Marylene Land, CNM  prenatal vitamin w/FE, FA (PRENATAL 1 + 1) 27-1 MG TABS tablet Take 1 tablet by mouth daily at 12 noon.    [provider]      Allergies     Peanut-containing drug products    Review of Systems   Review of Systems  Physical Exam Updated Vital Signs BP 109/84 (BP Location: Right Arm)   Pulse 70   Temp 98.1 F (36.7 C) (Oral)   Resp 14   Ht 5' (1.524 m)   Wt 51.3 kg   LMP 06/08/2022 (Exact Date)   SpO2 100%   BMI 22.07 kg/m  Physical Exam Vitals and nursing note reviewed.  Constitutional:      General: She is not in acute distress.    Appearance: She is well-developed.  HENT:     Head: Normocephalic and atraumatic.     Right Ear: External ear normal.     Left Ear: External ear normal.     Nose: Nose normal.  Eyes:     Extraocular Movements: Extraocular movements intact.     Conjunctiva/sclera: Conjunctivae normal.     Pupils: Pupils are equal, round, and reactive to light.  Cardiovascular:     Rate and Rhythm: Normal rate and regular rhythm.  Pulmonary:     Effort: Pulmonary effort is normal. No respiratory distress.  Abdominal:     General: Abdomen is flat. There is no distension.     Palpations: Abdomen is soft. There is no mass.     Tenderness: There is abdominal tenderness (Periumbilical). There is no guarding.  Musculoskeletal:     Cervical back: Normal range of motion and  neck supple.     Right lower leg: No edema.     Left lower leg: No edema.  Skin:    General: Skin is warm and dry.  Neurological:     Mental Status: She is alert and oriented to person, place, and time. Mental status is at baseline.  Psychiatric:        Mood and Affect: Mood normal.     ED Results / Procedures / Treatments   Labs (all labs ordered are listed, but only abnormal results are displayed) Labs Reviewed  BASIC METABOLIC PANEL - Abnormal; Notable for the following components:      Result Value   Potassium 3.3 (*)    CO2 21 (*)    All other components within normal limits  CBC - Abnormal; Notable for the following components:   Hemoglobin 10.8 (*)    HCT 35.6 (*)    MCV 73.3 (*)    MCH 22.2 (*)    RDW 17.4  (*)    All other components within normal limits  HEPATIC FUNCTION PANEL - Abnormal; Notable for the following components:   Total Protein 8.2 (*)    All other components within normal limits  LIPASE, BLOOD  SEDIMENTATION RATE  C-REACTIVE PROTEIN  I-STAT BETA HCG BLOOD, ED ([facility], [facility], [facility] ONLY)  TROPONIN I (HIGH SENSITIVITY)  TROPONIN I (HIGH SENSITIVITY)    EKG EKG Interpretation  Date/Time:  Saturday June 08 2022 14:38:13 EDT Ventricular Rate:  81 PR Interval:  134 QRS Duration: 82 QT Interval:  419 QTC Calculation: 487 R Axis:   85 Text Interpretation: Sinus rhythm Borderline prolonged QT interval Confirmed by Vonita Moss [phone]) on 06/08/2022 4:05:42 PM  Radiology US Abdomen Limited RUQ (LIVER/GB)  Result Date: 06/08/2022 CLINICAL DATA:  [id number] Epigastric pain [id number] EXAM: ULTRASOUND ABDOMEN LIMITED RIGHT UPPER QUADRANT COMPARISON:  Same day CT. FINDINGS: Gallbladder: No gallstones or wall thickening visualized. No sonographic Murphy sign noted by sonographer. Common bile duct: Diameter: Visualized portion measures 3 mm, within normal limits. Liver: No focal lesion identified. Within normal limits in parenchymal echogenicity. Portal vein is patent on color Doppler imaging with normal direction of blood flow towards the liver. Other: None. IMPRESSION: No sonographic etiology for abdominal pain identified. Electronically Signed   By: Meda Klinefelter M.D.   On: 06/08/2022 18:52   CT ABDOMEN PELVIS W CONTRAST  Result Date: 06/08/2022 CLINICAL DATA:  Epigastric abdominal pain. History of Crohn's disease. Previous appendectomy. EXAM: CT ABDOMEN AND PELVIS WITH CONTRAST TECHNIQUE: Multidetector CT imaging of the abdomen and pelvis was performed using the standard protocol following bolus administration of intravenous contrast. RADIATION DOSE REDUCTION: This exam was performed according to the departmental dose-optimization program which includes automated exposure control, adjustment of  the mA and/or kV according to patient size and/or use of iterative reconstruction technique. CONTRAST:  OMNIPAQUE IOHEXOL 300 MG/ML  SOLN COMPARISON:  Chest radiographs obtained earlier today. FINDINGS: Lower chest: Unremarkable. Hepatobiliary: No focal liver abnormality is seen. No gallstones, gallbladder wall thickening, or biliary dilatation. Pancreas: Unremarkable. No pancreatic ductal dilatation or surrounding inflammatory changes. Spleen: Normal in size without focal abnormality. Adrenals/Urinary Tract: Small lower pole right renal cyst. This does not need imaging follow-up. Unremarkable adrenal glands, left kidney, ureters and urinary bladder. Stomach/Bowel: Unremarkable stomach, small bowel and colon. Surgically absent appendix. Vascular/Lymphatic: No significant vascular findings are present. No enlarged abdominal or pelvic lymph nodes. Reproductive: Uterus and bilateral adnexa are unremarkable. Other: No abdominal wall hernia or abnormality. No abdominopelvic  ascites. Musculoskeletal: Unremarkable bones. IMPRESSION: No significant abnormality. Electronically Signed   By: Beckie Salts M.D.   On: 06/08/2022 16:49   DG Chest 2 View  Result Date: 06/08/2022 CLINICAL DATA:  Chest and back pain for 2 days. EXAM: CHEST - 2 VIEW COMPARISON:  None Available. FINDINGS: The heart size and mediastinal contours are within normal limits. Both lungs are clear. The visualized skeletal structures are unremarkable. IMPRESSION: Normal exam. Electronically Signed   By: Danae Orleans M.D.   On: 06/08/2022 15:06    Procedures Procedures   Medications Ordered in ED Medications  lactated ringers bolus 1,000 mL (0 mLs Intravenous Stopped 06/08/22 1622)  morphine (PF) 4 MG/ML injection 4 mg (4 mg Intravenous Given 06/08/22 1522)  ondansetron (ZOFRAN) injection 4 mg (4 mg Intravenous Given 06/08/22 1521)  potassium chloride SA (KLOR-CON M) CR tablet 40 mEq (40 mEq Oral Given 06/08/22 1621)  morphine (PF) 4 MG/ML injection  4 mg (4 mg Intravenous Given 06/08/22 1643)  iohexol (OMNIPAQUE) 300 MG/ML solution 100 mL (100 mLs Intravenous Contrast Given 06/08/22 1637)  famotidine (PEPCID) IVPB 20 mg premix (0 mg Intravenous Stopped 06/08/22 1909)  alum & mag hydroxide-simeth (MAALOX/MYLANTA) 200-200-20 MG/5ML suspension 30 mL (30 mLs Oral Given 06/08/22 1832)    And  lidocaine (XYLOCAINE) 2 % viscous mouth solution 15 mL (15 mLs Oral Given 06/08/22 1832)    ED Course/ Medical Decision Making/ A&P                             Medical Decision Making Amount and/or Complexity of Data Reviewed Labs: ordered. Radiology: ordered.  Risk OTC drugs. Prescription drug management.   Aidaliz Kesterson is a 32 y.o. female with comorbidities that complicate the patient evaluation including possible Crohn's disease and appendectomy who presents emergency department with abdominal pain  Initial Ddx:  Crohn's flare, cholecystitis, biliary colic, peptic ulcer disease, gastritis, pancreatitis  MDM:  Concern the patient may have a Crohn's flare based on the location of her pain.  Appears to be postprandial also biliary colic and peptic ulcer disease also on the differential.  Will obtain CT scan to evaluate for both Crohn's flare and pancreatitis.  Plan:  Labs Lipase Pepcid Viscous lidocaine CT abdomen pelvis IV contrast  ED Summary/Re-evaluation:  Patient underwent the above workup.  Was also given some morphine for pain.  Did experience relief after the Pepcid and viscous lidocaine.  Was still having some pain when drinking water so right upper quadrant ultrasound was obtained after her CT scan was unremarkable which did not show any evidence of radiolucent gallstones.  Feel that her symptoms are likely due to peptic ulcer disease or gastritis.  Will have her follow-up with GI and her PCP and prescribe her Pepcid and Maalox in the meantime.  Of note, troponins, EKG, and chest x-ray from triage unremarkable aside from prolonged  QT.  This patient presents to the ED for concern of complaints listed in HPI, this involves an extensive number of treatment options, and is a complaint that carries with it a high risk of complications and morbidity. Disposition including potential need for admission considered.   Dispo: DC Home. Return precautions discussed including, but not limited to, those listed in the AVS. Allowed pt time to ask questions which were answered fully prior to dc.  Additional history obtained from mother Records reviewed Outpatient Clinic Notes The following labs were independently interpreted: Chemistry and show no acute abnormality  I independently reviewed the following imaging with scope of interpretation limited to determining acute life threatening conditions related to emergency care: CT Abdomen/Pelvis and agree with the radiologist interpretation with the following exceptions: none I personally reviewed and interpreted cardiac monitoring: normal sinus rhythm  I personally reviewed and interpreted the pt's EKG: see above for interpretation  I have reviewed the patients home medications and made adjustments as needed  Final Clinical Impression(s) / ED Diagnoses Final diagnoses:  Peptic ulcer disease  Abdominal pain, unspecified abdominal location    Rx / DC Orders ED Discharge Orders          Ordered    famotidine (PEPCID) 20 MG tablet  Daily at bedtime        06/08/22 2058    alum & mag hydroxide-simeth (MAALOX MAX) 400-400-40 MG/5ML suspension  Every 6 hours PRN        06/08/22 2058              Rondel BatonPaterson, Evelynne Spiers C, MD 06/08/22 2102

## 2022-06-08 NOTE — ED Triage Notes (Addendum)
Pt presents with c/o abdominal pain that radiates to her back and chest x2 days. LBM today normal in consistency. Denies any nausea or vomiting.

## 2022-06-13 ENCOUNTER — Encounter: Payer: Self-pay | Admitting: *Deleted

## 2022-06-14 ENCOUNTER — Other Ambulatory Visit (INDEPENDENT_AMBULATORY_CARE_PROVIDER_SITE_OTHER): Payer: Self-pay

## 2022-06-14 ENCOUNTER — Encounter: Payer: Self-pay | Admitting: Gastroenterology

## 2022-06-14 ENCOUNTER — Inpatient Hospital Stay: Payer: Self-pay | Attending: Hematology and Oncology

## 2022-06-14 ENCOUNTER — Inpatient Hospital Stay (HOSPITAL_BASED_OUTPATIENT_CLINIC_OR_DEPARTMENT_OTHER): Payer: Self-pay | Admitting: Hematology and Oncology

## 2022-06-14 ENCOUNTER — Encounter: Payer: Self-pay | Admitting: *Deleted

## 2022-06-14 ENCOUNTER — Encounter: Payer: Self-pay | Admitting: Hematology and Oncology

## 2022-06-14 ENCOUNTER — Ambulatory Visit (INDEPENDENT_AMBULATORY_CARE_PROVIDER_SITE_OTHER): Payer: Self-pay | Admitting: Gastroenterology

## 2022-06-14 VITALS — BP 127/87 | HR 81 | Temp 98.1°F | Resp 16 | Wt 109.9 lb

## 2022-06-14 VITALS — BP 98/68 | HR 77 | Ht 60.0 in | Wt 109.0 lb

## 2022-06-14 DIAGNOSIS — D509 Iron deficiency anemia, unspecified: Secondary | ICD-10-CM

## 2022-06-14 DIAGNOSIS — Z79899 Other long term (current) drug therapy: Secondary | ICD-10-CM | POA: Insufficient documentation

## 2022-06-14 DIAGNOSIS — N920 Excessive and frequent menstruation with regular cycle: Secondary | ICD-10-CM

## 2022-06-14 DIAGNOSIS — R1013 Epigastric pain: Secondary | ICD-10-CM | POA: Insufficient documentation

## 2022-06-14 DIAGNOSIS — G4700 Insomnia, unspecified: Secondary | ICD-10-CM | POA: Insufficient documentation

## 2022-06-14 DIAGNOSIS — D500 Iron deficiency anemia secondary to blood loss (chronic): Secondary | ICD-10-CM

## 2022-06-14 LAB — CBC WITH DIFFERENTIAL (CANCER CENTER ONLY)
Abs Immature Granulocytes: 0 10*3/uL (ref 0.00–0.07)
Basophils Absolute: 0 10*3/uL (ref 0.0–0.1)
Basophils Relative: 0 %
Eosinophils Absolute: 0.1 10*3/uL (ref 0.0–0.5)
Eosinophils Relative: 2 %
HCT: 35.1 % — ABNORMAL LOW (ref 36.0–46.0)
Hemoglobin: 11.3 g/dL — ABNORMAL LOW (ref 12.0–15.0)
Immature Granulocytes: 0 %
Lymphocytes Relative: 37 %
Lymphs Abs: 1.8 10*3/uL (ref 0.7–4.0)
MCH: 23 pg — ABNORMAL LOW (ref 26.0–34.0)
MCHC: 32.2 g/dL (ref 30.0–36.0)
MCV: 71.5 fL — ABNORMAL LOW (ref 80.0–100.0)
Monocytes Absolute: 0.4 10*3/uL (ref 0.1–1.0)
Monocytes Relative: 7 %
Neutro Abs: 2.6 10*3/uL (ref 1.7–7.7)
Neutrophils Relative %: 54 %
Platelet Count: 325 10*3/uL (ref 150–400)
RBC: 4.91 MIL/uL (ref 3.87–5.11)
RDW: 17.2 % — ABNORMAL HIGH (ref 11.5–15.5)
WBC Count: 4.8 10*3/uL (ref 4.0–10.5)
nRBC: 0 % (ref 0.0–0.2)

## 2022-06-14 LAB — CMP (CANCER CENTER ONLY)
ALT: 27 U/L (ref 0–44)
AST: 21 U/L (ref 15–41)
Albumin: 4.9 g/dL (ref 3.5–5.0)
Alkaline Phosphatase: 61 U/L (ref 38–126)
Anion gap: 8 (ref 5–15)
BUN: 12 mg/dL (ref 6–20)
CO2: 26 mmol/L (ref 22–32)
Calcium: 9.7 mg/dL (ref 8.9–10.3)
Chloride: 103 mmol/L (ref 98–111)
Creatinine: 0.66 mg/dL (ref 0.44–1.00)
GFR, Estimated: >60 mL/min (ref >60)
Glucose, Bld: 93 mg/dL (ref 70–99)
Potassium: 3.6 mmol/L (ref 3.5–5.1)
Sodium: 137 mmol/L (ref 135–145)
Total Bilirubin: 0.3 mg/dL (ref 0.3–1.2)
Total Protein: 8.8 g/dL — ABNORMAL HIGH (ref 6.5–8.1)

## 2022-06-14 LAB — H. PYLORI ANTIBODY, IGG: H Pylori IgG: NEGATIVE

## 2022-06-14 LAB — IRON AND IRON BINDING CAPACITY (CC-WL,HP ONLY)
Iron: 19 ug/dL — ABNORMAL LOW (ref 28–170)
Saturation Ratios: 3 % — ABNORMAL LOW (ref 10.4–31.8)
TIBC: 652 ug/dL — ABNORMAL HIGH (ref 250–450)
UIBC: 633 ug/dL — ABNORMAL HIGH (ref 148–442)

## 2022-06-14 LAB — RETIC PANEL
Immature Retic Fract: 12.6 % (ref 2.3–15.9)
RBC.: 5.03 MIL/uL (ref 3.87–5.11)
Retic Count, Absolute: 49.3 10*3/uL (ref 19.0–186.0)
Retic Ct Pct: 1 % (ref 0.4–3.1)
Reticulocyte Hemoglobin: 22.9 pg — ABNORMAL LOW (ref >27.9)

## 2022-06-14 LAB — SEDIMENTATION RATE: Sed Rate: 7 mm/hr (ref 0–22)

## 2022-06-14 LAB — FERRITIN: Ferritin: 2 ng/mL — ABNORMAL LOW (ref 11–307)

## 2022-06-14 LAB — C-REACTIVE PROTEIN: CRP: 0.5 mg/dL (ref <1.0)

## 2022-06-14 MED ORDER — PANTOPRAZOLE SODIUM 40 MG PO TBEC: 40.0000 mg | DELAYED_RELEASE_TABLET | Freq: Every day | ORAL | 0 refills | Status: DC

## 2022-06-14 NOTE — Progress Notes (Signed)
[facility] [phone]   [phone]  INITIAL CONSULT NOTE  Patient Care Team: Patient, No Pcp Per as PCP - General (General Practice)  Hematological/Oncological History # Iron Deficiency Anemia 2/2 to GYN Bleeding 05/14/2022: Ferritin 3, Iron saturation 3%, Hgb 10.4, MCV 73, Plt 302, WBC 5.6 06/08/2022: WBC 4.2, Hgb 10.8, MCV 73.3, Plt 339 06/14/2022: establish care with Dr. Leonides Schanz   CHIEF COMPLAINTS/PURPOSE OF CONSULTATION:  "Iron Deficiency Anemia 2/2 to GYN Bleeding/Possible Crohn's Disease"  HISTORY OF PRESENTING ILLNESS:  Lacey Fischer 32 y.o. female with medical history significant for depression and possible Crohn's disease who presents for evaluation of iron deficiency anemia.   On review of the previous records Mrs. Lagle had labs collected on 05/14/2022 which showed ferritin of 3, iron sat 3%, hemoglobin 10.4, MCV 73, platelets 302, and white blood cell count 5.6.  Most recently on 06/08/2022 patient had a hemoglobin of 10.8 with an MCV of 73.3.  Due to concern for iron deficiency anemia she was referred to hematology for further evaluation and management.  Of note there is concern in her record for possible malabsorptive disorder or Crohn's disease.  This has not yet been confirmed and she has a gastroenterology appointment today in order to further evaluate.  On exam today [name] is accompanied by her husband.  She reports that this has been a problem "for a long time".  She notes that she tries to eat vegetables rich in iron.  She reports that 6 to 7 years ago she was referred to the "sickle cell center" and was found not to have sickle cell.  She reports that he does have issues with constipation and dry skin while taking the p.o. iron pills.  She reports that there is also an issue with iron when she was pregnant.  She reports her menstrual cycles can be quite heavy and can last for up to 14 days.  She is taking birth control pills but  they are "not helping".  She has a previously she was on Implanon which was removed last year.  She does currently have suspicion of Crohn's disease based on another provider's evaluation though she is not having any bleeding, diarrhea, or stomach upset other than the constipation from the iron pills.  She reports that her mother has previously been diagnosed with Crohn's disease.  She also has been having some issues with abdominal discomfort and pain.  On further discussion she reports her mother has Crohn's disease and her father has schizophrenia, she does not have any contact with him.  She reports that she had a cousin who had a baby that had leukemia.  She has 1 healthy child.  She is never smoker but does drink alcohol about once per month.  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  She reports her primary symptoms are low energy and tiredness.  She reports that she has been sleeping poorly and having difficulty with insomnia with her weight going up and down.  She reports that she does crave ice and has not had any overt signs of bleeding anywhere.  A full 10 point ROS is otherwise negative.  MEDICAL HISTORY:  Past Medical History:  Diagnosis Date   Anemia    Depression    in therapy, doing good   IDA (iron deficiency anemia)    Infection    UTI    SURGICAL HISTORY: Past Surgical History:  Procedure Laterality Date   APPENDECTOMY     NO PAST SURGERIES  SOCIAL HISTORY: Social History   Socioeconomic History   Marital status: Married    Spouse name: Not on file   Number of children: 1   Years of education: Not on file   Highest education level: Not on file  Occupational History   Occupation: home maker  Tobacco Use   Smoking status: Never   Smokeless tobacco: Never  Vaping Use   Vaping Use: Never used  Substance and Sexual Activity   Alcohol use: Yes    Comment: 1 a day   Drug use: No   Sexual activity: Yes    Birth control/protection: Implant  Other Topics Concern    Not on file  Social History Narrative   Not on file   Social Determinants of Health   Financial Resource Strain: Not on file  Food Insecurity: Not on file  Transportation Needs: Not on file  Physical Activity: Not on file  Stress: Not on file  Social Connections: Not on file  Intimate Partner Violence: Not on file    FAMILY HISTORY: Family History  Problem Relation Age of Onset   Healthy Mother    Healthy Father    Colon cancer Neg Hx    Stomach cancer Neg Hx    Esophageal cancer Neg Hx     ALLERGIES:  is allergic to other and peanut-containing drug products.  MEDICATIONS:  Current Outpatient Medications  Medication Sig Dispense Refill   alum & mag hydroxide-simeth (MAALOX MAX) 400-400-40 MG/5ML suspension Take 10 mLs by mouth every 6 (six) hours as needed for indigestion. (Patient not taking: Reported on 06/14/2022) 355 mL 0   famotidine (PEPCID) 20 MG tablet Take 2 tablets (40 mg total) by mouth at bedtime. 30 tablet 1   IRON PO Take 1 tablet by mouth daily.     EPINEPHrine (EPIPEN 2-PAK) 0.3 mg/0.3 mL IJ SOAJ injection Inject 0.3 mLs (0.3 mg total) into the muscle once as needed (for severe allergic reaction). CAll 911 immediately if you have to use this medicine (Patient not taking: Reported on 06/14/2022) 1 Device 1   pantoprazole (PROTONIX) 40 MG tablet Take 1 tablet (40 mg total) by mouth daily. 30 tablet 0   No current facility-administered medications for this visit.    REVIEW OF SYSTEMS:   Constitutional: ( - ) fevers, ( - )  chills , ( - ) night sweats Eyes: ( - ) blurriness of vision, ( - ) double vision, ( - ) watery eyes Ears, nose, mouth, throat, and face: ( - ) mucositis, ( - ) sore throat Respiratory: ( - ) cough, ( - ) dyspnea, ( - ) wheezes Cardiovascular: ( - ) palpitation, ( - ) chest discomfort, ( - ) lower extremity swelling Gastrointestinal:  ( - ) nausea, ( - ) heartburn, ( - ) change in bowel habits Skin: ( - ) abnormal skin rashes Lymphatics:  ( - ) new lymphadenopathy, ( - ) easy bruising Neurological: ( - ) numbness, ( - ) tingling, ( - ) new weaknesses Behavioral/Psych: ( - ) mood change, ( - ) new changes  All other systems were reviewed with the patient and are negative.  PHYSICAL EXAMINATION:  Vitals:   06/14/22 0856  BP: 127/87  Pulse: 81  Resp: 16  Temp: 98.1 F (36.7 C)  SpO2: 100%   Filed Weights   06/14/22 0856  Weight: 109 lb 14.4 oz (49.9 kg)    GENERAL: well appearing young female in NAD  SKIN: skin color, texture, turgor are  normal, no rashes or significant lesions EYES: conjunctiva are pink and non-injected, sclera clear LUNGS: clear to auscultation and percussion with normal breathing effort HEART: regular rate & rhythm and no murmurs and no lower extremity edema Musculoskeletal: no cyanosis of digits and no clubbing  PSYCH: alert & oriented x 3, fluent speech NEURO: no focal motor/sensory deficits  LABORATORY DATA:  I have reviewed the data as listed    Latest Ref Rng & Units 06/14/2022    9:47 AM 06/08/2022    2:50 PM 08/11/2016    1:50 PM  CBC  WBC 4.0 - 10.5 K/uL 4.8  4.2  3.4   Hemoglobin 12.0 - 15.0 g/dL 16.1  09.6  04.5   Hematocrit 36.0 - 46.0 % 35.1  35.6  36.2   Platelets 150 - 400 K/uL 325  339  219        Latest Ref Rng & Units 06/14/2022    9:47 AM 06/08/2022    3:35 PM 06/08/2022    2:50 PM  CMP  Glucose 70 - 99 mg/dL 93   86   BUN 6 - 20 mg/dL 12   9   Creatinine 4.09 - 1.00 mg/dL 8.11   9.14   Sodium 782 - 145 mmol/L 137   136   Potassium 3.5 - 5.1 mmol/L 3.6   3.3   Chloride 98 - 111 mmol/L 103   106   CO2 22 - 32 mmol/L 26   21   Calcium 8.9 - 10.3 mg/dL 9.7   9.5   Total Protein 6.5 - 8.1 g/dL 8.8  8.2    Total Bilirubin 0.3 - 1.2 mg/dL 0.3  0.5    Alkaline Phos 38 - 126 U/L 61  53    AST 15 - 41 U/L 21  25    ALT 0 - 44 U/L 27  26       ASSESSMENT & PLAN Lacey Fischer 32 y.o. female with medical history significant for depression and possible Crohn's disease  who presents for evaluation of iron deficiency anemia.   After review of the labs, review of the records, and discussion with the patient the patients findings are most consistent with iron deficiency anemia secondary to GYN bleeding.  There is also concern for possible absorptive disorder and she is being evaluated by gastroenterology later today.  # Iron Deficiency Anemia 2/2 to GYN Bleeding # Iron Deficieincy in Setting of Possible Crohn's Disease (?) or Absorption Disorder -- Findings are consistent with iron deficiency anemia secondary to patient's menstrual bleeding in conjunction with possible Crohn's disease.  --Encouraged her to follow-up with GI, appointment later today. Continue to follow with OB/GYN for control of menstrual cycles.  --We will confirm iron deficiency anemia by ordering iron panel and ferritin as well as reticulocytes, CBC, and CMP --HOLD ferrous sulfate as this is likely not absorbing well if she has IBD.  Additionally has not effectively increased her iron and hemoglobin levels. --We will plan to proceed with IV iron therapy in order to help bolster the patient's blood counts --Additionally will order IgA and tissue transglutaminase per the request of gastroenterology. --Plan for return to clinic in 4 to 6 weeks time after last dose of IV iron   Orders Placed This Encounter  Procedures   CBC with Differential (Cancer Center Only)    Standing Status:   Future    Number of Occurrences:   1    Standing Expiration Date:   06/14/2023   CMP (  Cancer Center only)    Standing Status:   Future    Number of Occurrences:   1    Standing Expiration Date:   06/14/2023   Ferritin    Standing Status:   Future    Number of Occurrences:   1    Standing Expiration Date:   06/14/2023   Iron and Iron Binding Capacity ([facility] only)    Standing Status:   Future    Number of Occurrences:   1    Standing Expiration Date:   06/14/2023   Retic Panel    Standing Status:   Future     Number of Occurrences:   1    Standing Expiration Date:   06/14/2023   Sedimentation rate    Standing Status:   Future    Number of Occurrences:   1    Standing Expiration Date:   06/14/2023   C-reactive protein    Standing Status:   Future    Number of Occurrences:   1    Standing Expiration Date:   06/14/2023   IgA    Standing Status:   Future    Number of Occurrences:   1    Standing Expiration Date:   06/14/2023   Tissue transglutaminase, IgA    Standing Status:   Future    Number of Occurrences:   1    Standing Expiration Date:   06/14/2023    All questions were answered. The patient knows to call the clinic with any problems, questions or concerns.  A total of more than 60 minutes were spent on this encounter with face-to-face time and non-face-to-face time, including preparing to see the patient, ordering tests and/or medications, counseling the patient and coordination of care as outlined above.   Ulysees Barns, MD Department of Hematology/Oncology [facility] Phone: [phone] Pager: [phone] Email: [email or web address]  06/15/2022 4:29 PM

## 2022-06-14 NOTE — Patient Instructions (Addendum)
We have sent the following medications to your pharmacy for you to pick up at your convenience: Pantoprazole 40 mg daily  Discontinue pepcid (famotidine).  Please follow up with Doug Sou, PA-C on 08/13/22 at 2:00 pm.  Your provider has requested that you go to the basement level for lab work before leaving today. Press "B" on the elevator. The lab is located at the first door on the left as you exit the elevator.  _______________________________________________________  If your blood pressure at your visit was 140/90 or greater, please contact your primary care physician to follow up on this.  _______________________________________________________  If you are age 65 or older, your body mass index should be between 23-30. Your Body mass index is 21.29 kg/m. If this is out of the aforementioned range listed, please consider follow up with your Primary Care Provider.  If you are age 76 or younger, your body mass index should be between 19-25. Your Body mass index is 21.29 kg/m. If this is out of the aformentioned range listed, please consider follow up with your Primary Care Provider.   ________________________________________________________  The [facility] providers would like to encourage you to use [facility] to communicate with providers for non-urgent requests or questions.  Due to long hold times on the telephone, sending your provider a message by [facility] may be a faster and more efficient way to get a response.  Please allow 48 business hours for a response.  Please remember that this is for non-urgent requests.  _______________________________________________________  Due to recent changes in healthcare laws, you may see the results of your imaging and laboratory studies on MyChart before your provider has had a chance to review them.  We understand that in some cases there may be results that are confusing or concerning to you. Not all laboratory results come back in the same time  frame and the provider may be waiting for multiple results in order to interpret others.  Please give [place] 48 hours in order for your provider to thoroughly review all the results before contacting the office for clarification of your results.

## 2022-06-14 NOTE — Progress Notes (Signed)
06/14/2022 Lacey Fischer [id number] [birth year]   HISTORY OF PRESENT ILLNESS: This is a 32 year old female who is new to our office.  She is here today for evaluation of iron deficiency anemia.  She actually just saw hematology this morning as well who performed several labs including celiac labs.  Iron deficiency anemia has been longstanding.  She says that historically she does have very heavy and erratic periods.  She has been trying to work with GYN in regards to that situation.  Nonetheless, she is likely going to get some IV iron infusions.  There was some question of Crohn's disease, although I am not quite sure where that came from.  CRP and sed rate last week and then again today were normal.  She was actually in the emergency department on Saturday with complaints of upper abdominal pain.  This was new, but during that ER visit CT scan of the abdomen and pelvis with contrast was ordered and was unremarkable.  She also had a right upper quadrant ultrasound performed and that was unremarkable as well.  She says that on Friday she developed this upper abdominal pain that was burning in nature and seemed to be worse with spicy foods and citrusy based foods.  In the ER they put her on Pepcid and she has been taking that with improvement in her symptoms.  Otherwise she says that her weight tends to fluctuate.  If anything she sometimes has some constipation, but no diarrhea.  She denies any sign of GI bleeding including black or bloody stools.  Today hemoglobin is 11.3 g with a ferritin of 2, iron saturation of 3%, serum iron 19, TIBC elevated at 652.  Referred here by Charlies Silvers, NP.   Past Medical History:  Diagnosis Date   Anemia    Depression    in therapy, doing good   IDA (iron deficiency anemia)    Infection    UTI   Past Surgical History:  Procedure Laterality Date   APPENDECTOMY     NO PAST SURGERIES      reports that she has never smoked. She has never used  smokeless tobacco. She reports current alcohol use. She reports that she does not use drugs. family history includes Healthy in her father and mother. Allergies  Allergen Reactions   Other Swelling   Peanut-Containing Drug Products Anaphylaxis, Rash, Swelling and Other (See Comments)      Outpatient Encounter Medications as of 06/14/2022  Medication Sig   famotidine (PEPCID) 20 MG tablet Take 2 tablets (40 mg total) by mouth at bedtime.   IRON PO Take 1 tablet by mouth daily.   alum & mag hydroxide-simeth (MAALOX MAX) 400-400-40 MG/5ML suspension Take 10 mLs by mouth every 6 (six) hours as needed for indigestion. (Patient not taking: Reported on 06/14/2022)   EPINEPHrine (EPIPEN 2-PAK) 0.3 mg/0.3 mL IJ SOAJ injection Inject 0.3 mLs (0.3 mg total) into the muscle once as needed (for severe allergic reaction). CAll 911 immediately if you have to use this medicine (Patient not taking: Reported on 06/14/2022)   No facility-administered encounter medications on file as of 06/14/2022.     REVIEW OF SYSTEMS  : All other systems reviewed and negative except where noted in the History of Present Illness.   PHYSICAL EXAM: BP 98/68   Pulse 77   Ht 5' (1.524 m)   Wt 109 lb (49.4 kg)   LMP 06/08/2022 (Exact Date)   BMI 21.29 kg/m  General: Well developed female  in no acute distress Head: Normocephalic and atraumatic Eyes:  Sclerae anicteric, conjunctiva pink. Ears: Normal auditory acuity Lungs: Clear throughout to auscultation; no W/R/R. Heart: Regular rate and rhythm; no M/R/G. Abdomen: Soft, non-distended.  BS present.  Mild mid-epigastric TTP. Musculoskeletal: Symmetrical with no gross deformities  Skin: No lesions on visible extremities Extremities: No edema  Neurological: Alert oriented x 4, grossly non-focal Psychological:  Alert and cooperative. Normal mood and affect  ASSESSMENT AND PLAN: *32 year old female with iron deficiency anemia:  This been longstanding and likely due to  GYN bleeding.  No sign of GI bleeding.  She saw hematology this morning and is going to get IV iron infusions.  Several labs were performed including celiac labs and several are still pending. *Question of Crohn's disease: Not exactly sure where this is coming from.  Her sed rate and CRPs have been normal.  She recently had a CT scan last week that was unrevealing. *Epigastric abdominal pain: This has been present for just past week, improved on Pepcid, worse after eating eating spicy and citrusy type foods, which she does a lot of.  Will check H. pylori antibody as she is Hispanic in origin.  Will discontinue the Pepcid and will have her do pantoprazole 40 mg daily for the next several weeks.  **She will follow-up with me in 8 to 10 weeks.  **Addendum: After the patient left we found that she had an elevated Saccharomyces cerevisiae IgG, but I do not see the actual results.  I will try to obtain those results next week, but once again by inflammatory markers and recent CT imaging she has no evidence of Crohn's.  None of her just recent GI symptoms are consistent with Crohn's disease either.  ? Consider a fecal calprotectin.   CC:  Cullop, Adele Barthel, NP

## 2022-06-15 ENCOUNTER — Encounter: Payer: Self-pay | Admitting: Hematology and Oncology

## 2022-06-15 LAB — TISSUE TRANSGLUTAMINASE, IGA: Tissue Transglutaminase Ab, IgA: <2 U/mL (ref 0–3)

## 2022-06-17 ENCOUNTER — Encounter: Payer: Self-pay | Admitting: Hematology and Oncology

## 2022-06-18 LAB — IGA: IgA: 299 mg/dL (ref 87–352)

## 2022-06-18 NOTE — Progress Notes (Signed)
Agree with the assessment and plan as outlined by Doug Sou, PA-C.  Even with a positive Saccharomyces cerevisiae IgG, given the normal inflammatory markers, normal CT, and in the absence of diarrhea, do not think fecal calprotectin indicated at this juncture.  Agree with plan as outlined.  Judie Hollick, DO, [facility]

## 2022-06-21 ENCOUNTER — Telehealth: Payer: Self-pay | Admitting: Pharmacy Technician

## 2022-06-21 NOTE — Telephone Encounter (Signed)
Dr. Leonides Schanz, [place] Childes note  Auth Submission: APPROVED  FREE DRUG - PAP Site of care: Site of care: [facility] Payer: [employer] Medication & CPT/J Code(s) submitted: Monoferric (Ferrci derisomaltose) [phone] Route of submission (phone, fax, portal):  Phone # [phone] Fax # Auth type: Buy/Bill Units/visits requested: 1 Reference number: [id number] Approval from: 06/21/22 to 06/21/23

## 2022-06-26 ENCOUNTER — Encounter: Payer: Self-pay | Admitting: Hematology and Oncology

## 2022-06-27 ENCOUNTER — Ambulatory Visit (INDEPENDENT_AMBULATORY_CARE_PROVIDER_SITE_OTHER): Payer: Self-pay

## 2022-06-27 VITALS — BP 112/68 | HR 72 | Temp 98.2°F | Resp 18 | Ht 60.0 in | Wt 115.0 lb

## 2022-06-27 DIAGNOSIS — D500 Iron deficiency anemia secondary to blood loss (chronic): Secondary | ICD-10-CM

## 2022-06-27 DIAGNOSIS — N920 Excessive and frequent menstruation with regular cycle: Secondary | ICD-10-CM

## 2022-06-27 MED ORDER — SODIUM CHLORIDE 0.9 % IV SOLN
1000.0000 mg | Freq: Once | INTRAVENOUS | Status: AC
  Administered 2022-06-27: 1000 mg via INTRAVENOUS
  Filled 2022-06-27: qty 10

## 2022-06-27 NOTE — Progress Notes (Signed)
Diagnosis: Iron Deficiency Anemia  Provider:  Chilton Greathouse MD  Procedure: IV Infusion  IV Type: Peripheral, IV Location: L Antecubital  Monoferric (Ferric Derisomaltose), Dose: 1000 mg  Infusion Start Time: 1333  pm  Infusion Stop Time: 1355 pm  Post Infusion IV Care: Observation period completed and Peripheral IV Discontinued  Discharge: Condition: Good, Destination: Home . AVS Provided  Performed by:  Adriana Mccallum, RN

## 2022-06-27 NOTE — Patient Instructions (Signed)

## 2022-07-09 ENCOUNTER — Encounter: Payer: Self-pay | Admitting: Hematology and Oncology

## 2022-07-10 ENCOUNTER — Other Ambulatory Visit: Payer: Self-pay | Admitting: Gastroenterology

## 2022-07-12 ENCOUNTER — Ambulatory Visit: Payer: Medicaid Other | Admitting: Gastroenterology

## 2022-08-05 ENCOUNTER — Inpatient Hospital Stay: Payer: Self-pay | Attending: Hematology and Oncology

## 2022-08-05 ENCOUNTER — Other Ambulatory Visit: Payer: Self-pay

## 2022-08-05 ENCOUNTER — Other Ambulatory Visit: Payer: Self-pay | Admitting: Hematology and Oncology

## 2022-08-05 DIAGNOSIS — D500 Iron deficiency anemia secondary to blood loss (chronic): Secondary | ICD-10-CM

## 2022-08-05 DIAGNOSIS — N920 Excessive and frequent menstruation with regular cycle: Secondary | ICD-10-CM | POA: Insufficient documentation

## 2022-08-05 DIAGNOSIS — Z79899 Other long term (current) drug therapy: Secondary | ICD-10-CM | POA: Insufficient documentation

## 2022-08-05 LAB — CBC WITH DIFFERENTIAL (CANCER CENTER ONLY)
Abs Immature Granulocytes: 0.01 10*3/uL (ref 0.00–0.07)
Basophils Absolute: 0 10*3/uL (ref 0.0–0.1)
Basophils Relative: 0 %
Eosinophils Absolute: 0.1 10*3/uL (ref 0.0–0.5)
Eosinophils Relative: 2 %
HCT: 43.6 % (ref 36.0–46.0)
Hemoglobin: 14.3 g/dL (ref 12.0–15.0)
Immature Granulocytes: 0 %
Lymphocytes Relative: 42 %
Lymphs Abs: 2.2 10*3/uL (ref 0.7–4.0)
MCH: 26.9 pg (ref 26.0–34.0)
MCHC: 32.8 g/dL (ref 30.0–36.0)
MCV: 82.1 fL (ref 80.0–100.0)
Monocytes Absolute: 0.3 10*3/uL (ref 0.1–1.0)
Monocytes Relative: 6 %
Neutro Abs: 2.7 10*3/uL (ref 1.7–7.7)
Neutrophils Relative %: 50 %
Platelet Count: 244 10*3/uL (ref 150–400)
RBC: 5.31 MIL/uL — ABNORMAL HIGH (ref 3.87–5.11)
Smear Review: NORMAL
WBC Count: 5.3 10*3/uL (ref 4.0–10.5)
nRBC: 0 % (ref 0.0–0.2)

## 2022-08-05 LAB — CMP (CANCER CENTER ONLY)
ALT: 15 U/L (ref 0–44)
AST: 15 U/L (ref 15–41)
Albumin: 4.8 g/dL (ref 3.5–5.0)
Alkaline Phosphatase: 58 U/L (ref 38–126)
Anion gap: 10 (ref 5–15)
BUN: 12 mg/dL (ref 6–20)
CO2: 22 mmol/L (ref 22–32)
Calcium: 9.6 mg/dL (ref 8.9–10.3)
Chloride: 106 mmol/L (ref 98–111)
Creatinine: 0.69 mg/dL (ref 0.44–1.00)
GFR, Estimated: >60 mL/min (ref >60)
Glucose, Bld: 115 mg/dL — ABNORMAL HIGH (ref 70–99)
Potassium: 3.7 mmol/L (ref 3.5–5.1)
Sodium: 138 mmol/L (ref 135–145)
Total Bilirubin: 0.4 mg/dL (ref 0.3–1.2)
Total Protein: 7.9 g/dL (ref 6.5–8.1)

## 2022-08-05 LAB — RETIC PANEL
Immature Retic Fract: 10.5 % (ref 2.3–15.9)
RBC.: 5.28 MIL/uL — ABNORMAL HIGH (ref 3.87–5.11)
Retic Count, Absolute: 77.1 10*3/uL (ref 19.0–186.0)
Retic Ct Pct: 1.5 % (ref 0.4–3.1)
Reticulocyte Hemoglobin: 33.9 pg (ref >27.9)

## 2022-08-05 LAB — FERRITIN: Ferritin: 54 ng/mL (ref 11–307)

## 2022-08-05 LAB — IRON AND IRON BINDING CAPACITY (CC-WL,HP ONLY)
Iron: 102 ug/dL (ref 28–170)
Saturation Ratios: 30 % (ref 10.4–31.8)
TIBC: 336 ug/dL (ref 250–450)
UIBC: 234 ug/dL (ref 148–442)

## 2022-08-13 ENCOUNTER — Ambulatory Visit: Payer: Self-pay | Admitting: Gastroenterology

## 2022-08-14 ENCOUNTER — Inpatient Hospital Stay (HOSPITAL_BASED_OUTPATIENT_CLINIC_OR_DEPARTMENT_OTHER): Payer: Self-pay | Admitting: Hematology and Oncology

## 2022-08-14 ENCOUNTER — Other Ambulatory Visit: Payer: Self-pay

## 2022-08-14 VITALS — BP 111/95 | HR 68 | Temp 97.5°F | Resp 17 | Wt 122.9 lb

## 2022-08-14 DIAGNOSIS — D500 Iron deficiency anemia secondary to blood loss (chronic): Secondary | ICD-10-CM

## 2022-08-14 NOTE — Progress Notes (Signed)
[facility] [phone]   [phone]  PROGRESS NOTE  Patient Care Team: Patient, No Pcp Per as PCP - General (General Practice)  Hematological/Oncological History # Iron Deficiency Anemia 2/2 to GYN Bleeding 05/14/2022: Ferritin 3, Iron saturation 3%, Hgb 10.4, MCV 73, Plt 302, WBC 5.6 06/08/2022: WBC 4.2, Hgb 10.8, MCV 73.3, Plt 339 06/14/2022: establish care with Dr. Leonides Schanz  06/27/2022: IV monoferric 1000 mg   Interval History:  Lacey Fischer 32 y.o. female with medical history significant for iron deficiency anemia secondary to GYN bleeding who presents for a follow up visit. The patient's last visit was on 06/14/2022 at which time she established care. In the interim since the last visit she has received IV Monoferric 1000 mg.  On exam today Lacey Fischer is accompanied by her husband.  She reports she feels well overall.  She notes that she did have some fevers and chills as well as body aches after receiving the IV iron therapy but that subsided pretty quickly.  She reports her energy today is about a 9.5 out of 10.  She has quite a bit of energy and feels quite well.  She was evaluated by gastroenterology who provided her with Maalox which has been helping with her symptoms.  She is not having any nausea, vomiting, or diarrhea.  She notes that she does have a little bit of bruising on her legs and elbows but nothing extreme.  She reports that her menstrual cycles have stopped since the change contraception to receive the long-acting shots.  She has not had a period in the interim since her last visit.  She otherwise denies any fevers, chills, sweats, nausea, vomiting or diarrhea.  A full 10 point ROS is otherwise negative.  MEDICAL HISTORY:  Past Medical History:  Diagnosis Date   Anemia    Depression    in therapy, doing good   IDA (iron deficiency anemia)    Infection    UTI    SURGICAL HISTORY: Past Surgical History:  Procedure Laterality  Date   APPENDECTOMY     NO PAST SURGERIES      SOCIAL HISTORY: Social History   Socioeconomic History   Marital status: Married    Spouse name: Not on file   Number of children: 1   Years of education: Not on file   Highest education level: Not on file  Occupational History   Occupation: home maker  Tobacco Use   Smoking status: Never   Smokeless tobacco: Never  Vaping Use   Vaping Use: Never used  Substance and Sexual Activity   Alcohol use: Yes    Comment: 1 a day   Drug use: No   Sexual activity: Yes    Birth control/protection: Implant  Other Topics Concern   Not on file  Social History Narrative   Not on file   Social Determinants of Health   Financial Resource Strain: Not on file  Food Insecurity: Not on file  Transportation Needs: Not on file  Physical Activity: Not on file  Stress: Not on file  Social Connections: Not on file  Intimate Partner Violence: Not on file    FAMILY HISTORY: Family History  Problem Relation Age of Onset   Healthy Mother    Healthy Father    Colon cancer Neg Hx    Stomach cancer Neg Hx    Esophageal cancer Neg Hx     ALLERGIES:  is allergic to other and peanut-containing drug products.  MEDICATIONS:  Current Outpatient Medications  Medication Sig Dispense Refill   alum & mag hydroxide-simeth (MAALOX MAX) 400-400-40 MG/5ML suspension Take 10 mLs by mouth every 6 (six) hours as needed for indigestion. (Patient not taking: Reported on 06/14/2022) 355 mL 0   EPINEPHrine (EPIPEN 2-PAK) 0.3 mg/0.3 mL IJ SOAJ injection Inject 0.3 mLs (0.3 mg total) into the muscle once as needed (for severe allergic reaction). CAll 911 immediately if you have to use this medicine (Patient not taking: Reported on 06/14/2022) 1 Device 1   famotidine (PEPCID) 20 MG tablet Take 2 tablets (40 mg total) by mouth at bedtime. 30 tablet 1   IRON PO Take 1 tablet by mouth daily.     pantoprazole (PROTONIX) 40 MG tablet TAKE 1 TABLET BY MOUTH EVERY DAY 90  tablet 1   No current facility-administered medications for this visit.    REVIEW OF SYSTEMS:   Constitutional: ( - ) fevers, ( - )  chills , ( - ) night sweats Eyes: ( - ) blurriness of vision, ( - ) double vision, ( - ) watery eyes Ears, nose, mouth, throat, and face: ( - ) mucositis, ( - ) sore throat Respiratory: ( - ) cough, ( - ) dyspnea, ( - ) wheezes Cardiovascular: ( - ) palpitation, ( - ) chest discomfort, ( - ) lower extremity swelling Gastrointestinal:  ( - ) nausea, ( - ) heartburn, ( - ) change in bowel habits Skin: ( - ) abnormal skin rashes Lymphatics: ( - ) new lymphadenopathy, ( - ) easy bruising Neurological: ( - ) numbness, ( - ) tingling, ( - ) new weaknesses Behavioral/Psych: ( - ) mood change, ( - ) new changes  All other systems were reviewed with the patient and are negative.  PHYSICAL EXAMINATION:  Vitals:   08/14/22 1121  BP: (!) 111/95  Pulse: 68  Resp: 17  Temp: (!) 97.5 F (36.4 C)  SpO2: 100%   Filed Weights   08/14/22 1121  Weight: 122 lb 14.4 oz (55.7 kg)    GENERAL: Well-appearing young female, alert, no distress and comfortable SKIN: skin color, texture, turgor are normal, no rashes or significant lesions EYES: conjunctiva are pink and non-injected, sclera clear LUNGS: clear to auscultation and percussion with normal breathing effort HEART: regular rate & rhythm and no murmurs and no lower extremity edema Musculoskeletal: no cyanosis of digits and no clubbing  PSYCH: alert & oriented x 3, fluent speech NEURO: no focal motor/sensory deficits  LABORATORY DATA:  I have reviewed the data as listed    Latest Ref Rng & Units 08/05/2022    7:48 AM 06/14/2022    9:47 AM 06/08/2022    2:50 PM  CBC  WBC 4.0 - 10.5 K/uL 5.3  4.8  4.2   Hemoglobin 12.0 - 15.0 g/dL 16.1  09.6  04.5   Hematocrit 36.0 - 46.0 % 43.6  35.1  35.6   Platelets 150 - 400 K/uL 244  325  339        Latest Ref Rng & Units 08/05/2022    7:48 AM 06/14/2022    9:47 AM  06/08/2022    3:35 PM  CMP  Glucose 70 - 99 mg/dL 409  93    BUN 6 - 20 mg/dL 12  12    Creatinine 8.11 - 1.00 mg/dL 9.14  7.82    Sodium 956 - 145 mmol/L 138  137    Potassium 3.5 - 5.1 mmol/L 3.7  3.6  Chloride 98 - 111 mmol/L 106  103    CO2 22 - 32 mmol/L 22  26    Calcium 8.9 - 10.3 mg/dL 9.6  9.7    Total Protein 6.5 - 8.1 g/dL 7.9  8.8  8.2   Total Bilirubin 0.3 - 1.2 mg/dL 0.4  0.3  0.5   Alkaline Phos 38 - 126 U/L 58  61  53   AST 15 - 41 U/L 15  21  25    ALT 0 - 44 U/L 15  27  26      RADIOGRAPHIC STUDIES: No results found.  ASSESSMENT & PLAN Lacey Fischer 32 y.o. female with medical history significant for iron deficiency anemia secondary to GYN bleeding who presents for a follow up visit.  After review of the labs, review of the records, and discussion with the patient the patients findings are most consistent with iron deficiency anemia secondary to GYN bleeding.  There is also concern for possible absorptive disorder and she is being evaluated by gastroenterology later today.   # Iron Deficiency Anemia 2/2 to GYN Bleeding -- Findings are consistent with iron deficiency anemia secondary to patient's menstrual bleeding  --Encouraged her to follow-up with OB/GYN for control of menstrual cycles.  --HOLD ferrous sulfate as this has not effectively increased her iron and hemoglobin levels. --We will plan to proceed with IV iron therapy in order to help bolster the patient's blood counts if she is found to be iron deficient again in the future. --Labs today show white blood cell count 5.3, hemoglobin 14.3, MCV 82.1, and platelets of 244 --Patient currently following with gastroenterology for her stomach upset. --Plan for return to clinic in 6 months time for labs and 12 months for labs/clinic visit   No orders of the defined types were placed in this encounter.   All questions were answered. The patient knows to call the clinic with any problems, questions or  concerns.  A total of more than 25 minutes were spent on this encounter with face-to-face time and non-face-to-face time, including preparing to see the patient, ordering tests and/or medications, counseling the patient and coordination of care as outlined above.   Ulysees Barns, MD Department of Hematology/Oncology [facility] Phone: [phone] Pager: [phone] Email: [email or web address]  08/14/2022 1:32 PM

## 2023-02-07 ENCOUNTER — Other Ambulatory Visit: Payer: Self-pay | Admitting: *Deleted

## 2023-02-07 DIAGNOSIS — D500 Iron deficiency anemia secondary to blood loss (chronic): Secondary | ICD-10-CM

## 2023-02-10 ENCOUNTER — Inpatient Hospital Stay: Payer: MEDICAID | Attending: Hematology and Oncology

## 2023-08-13 ENCOUNTER — Inpatient Hospital Stay: Payer: MEDICAID | Attending: Hematology and Oncology

## 2023-08-13 ENCOUNTER — Inpatient Hospital Stay: Payer: MEDICAID | Admitting: Hematology and Oncology

## 2023-08-13 NOTE — Progress Notes (Deleted)
 [facility] [phone]   [phone]  PROGRESS NOTE  Patient Care Team: Patient, No Pcp Per as PCP - General (General Practice)  Hematological/Oncological History # Iron Deficiency Anemia 2/2 to GYN Bleeding 05/14/2022: Ferritin 3, Iron saturation 3%, Hgb 10.4, MCV 73, Plt 302, WBC 5.6 06/08/2022: WBC 4.2, Hgb 10.8, MCV 73.3, Plt 339 06/14/2022: establish care with Dr. Rosaline Coma  06/27/2022: IV monoferric  1000 mg   Interval History:  Lacey Fischer 33 y.o. female with medical history significant for iron deficiency anemia secondary to GYN bleeding who presents for a follow up visit. The patient's last visit was on 06/14/2022 at which time she established care. In the interim since the last visit she has received IV Monoferric  1000 mg.  On exam today Lacey Fischer is accompanied by her husband.  She reports she feels well overall.  She notes that she did have some fevers and chills as well as body aches after receiving the IV iron therapy but that subsided pretty quickly.  She reports her energy today is about a 9.5 out of 10.  She has quite a bit of energy and feels quite well.  She was evaluated by gastroenterology who provided her with Maalox which has been helping with her symptoms.  She is not having any nausea, vomiting, or diarrhea.  She notes that she does have a little bit of bruising on her legs and elbows but nothing extreme.  She reports that her menstrual cycles have stopped since the change contraception to receive the long-acting shots.  She has not had a period in the interim since her last visit.  She otherwise denies any fevers, chills, sweats, nausea, vomiting or diarrhea.  A full 10 point ROS is otherwise negative.  MEDICAL HISTORY:  Past Medical History:  Diagnosis Date   Anemia    Depression    in therapy, doing good   IDA (iron deficiency anemia)    Infection    UTI    SURGICAL HISTORY: Past Surgical History:  Procedure Laterality  Date   APPENDECTOMY     NO PAST SURGERIES      SOCIAL HISTORY: Social History   Socioeconomic History   Marital status: Married    Spouse name: Not on file   Number of children: 1   Years of education: Not on file   Highest education level: Not on file  Occupational History   Occupation: home maker  Tobacco Use   Smoking status: Never   Smokeless tobacco: Never  Vaping Use   Vaping status: Never Used  Substance and Sexual Activity   Alcohol use: Yes    Comment: 1 a day   Drug use: No   Sexual activity: Yes    Birth control/protection: Implant  Other Topics Concern   Not on file  Social History Narrative   Not on file   Social Drivers of Health   Financial Resource Strain: Not on file  Food Insecurity: Not on file  Transportation Needs: Not on file  Physical Activity: Not on file  Stress: Not on file  Social Connections: Not on file  Intimate Partner Violence: Not on file    FAMILY HISTORY: Family History  Problem Relation Age of Onset   Healthy Mother    Healthy Father    Colon cancer Neg Hx    Stomach cancer Neg Hx    Esophageal cancer Neg Hx     ALLERGIES:  is allergic to other and peanut-containing drug products.  MEDICATIONS:  Current Outpatient Medications  Medication Sig Dispense Refill   alum & mag hydroxide-simeth (MAALOX MAX) 400-400-40 MG/5ML suspension Take 10 mLs by mouth every 6 (six) hours as needed for indigestion. (Patient not taking: Reported on 06/14/2022) 355 mL 0   EPINEPHrine  (EPIPEN  2-PAK) 0.3 mg/0.3 mL IJ SOAJ injection Inject 0.3 mLs (0.3 mg total) into the muscle once as needed (for severe allergic reaction). CAll 911 immediately if you have to use this medicine (Patient not taking: Reported on 06/14/2022) 1 Device 1   famotidine  (PEPCID ) 20 MG tablet Take 2 tablets (40 mg total) by mouth at bedtime. 30 tablet 1   IRON PO Take 1 tablet by mouth daily.     pantoprazole  (PROTONIX ) 40 MG tablet TAKE 1 TABLET BY MOUTH EVERY DAY 90  tablet 1   No current facility-administered medications for this visit.    REVIEW OF SYSTEMS:   Constitutional: ( - ) fevers, ( - )  chills , ( - ) night sweats Eyes: ( - ) blurriness of vision, ( - ) double vision, ( - ) watery eyes Ears, nose, mouth, throat, and face: ( - ) mucositis, ( - ) sore throat Respiratory: ( - ) cough, ( - ) dyspnea, ( - ) wheezes Cardiovascular: ( - ) palpitation, ( - ) chest discomfort, ( - ) lower extremity swelling Gastrointestinal:  ( - ) nausea, ( - ) heartburn, ( - ) change in bowel habits Skin: ( - ) abnormal skin rashes Lymphatics: ( - ) new lymphadenopathy, ( - ) easy bruising Neurological: ( - ) numbness, ( - ) tingling, ( - ) new weaknesses Behavioral/Psych: ( - ) mood change, ( - ) new changes  All other systems were reviewed with the patient and are negative.  PHYSICAL EXAMINATION:  There were no vitals filed for this visit.  There were no vitals filed for this visit.   GENERAL: Well-appearing young female, alert, no distress and comfortable SKIN: skin color, texture, turgor are normal, no rashes or significant lesions EYES: conjunctiva are pink and non-injected, sclera clear LUNGS: clear to auscultation and percussion with normal breathing effort HEART: regular rate & rhythm and no murmurs and no lower extremity edema Musculoskeletal: no cyanosis of digits and no clubbing  PSYCH: alert & oriented x 3, fluent speech NEURO: no focal motor/sensory deficits  LABORATORY DATA:  I have reviewed the data as listed    Latest Ref Rng & Units 08/05/2022    7:48 AM 06/14/2022    9:47 AM 06/08/2022    2:50 PM  CBC  WBC 4.0 - 10.5 K/uL 5.3  4.8  4.2   Hemoglobin 12.0 - 15.0 g/dL 16.1  09.6  04.5   Hematocrit 36.0 - 46.0 % 43.6  35.1  35.6   Platelets 150 - 400 K/uL 244  325  339        Latest Ref Rng & Units 08/05/2022    7:48 AM 06/14/2022    9:47 AM 06/08/2022    3:35 PM  CMP  Glucose 70 - 99 mg/dL 409  93    BUN 6 - 20 mg/dL 12  12     Creatinine 8.11 - 1.00 mg/dL 9.14  7.82    Sodium 956 - 145 mmol/L 138  137    Potassium 3.5 - 5.1 mmol/L 3.7  3.6    Chloride 98 - 111 mmol/L 106  103    CO2 22 - 32 mmol/L 22  26    Calcium 8.9 -  10.3 mg/dL 9.6  9.7    Total Protein 6.5 - 8.1 g/dL 7.9  8.8  8.2   Total Bilirubin 0.3 - 1.2 mg/dL 0.4  0.3  0.5   Alkaline Phos 38 - 126 U/L 58  61  53   AST 15 - 41 U/L 15  21  25    ALT 0 - 44 U/L 15  27  26      RADIOGRAPHIC STUDIES: No results found.  ASSESSMENT & PLAN Lacey Fischer 33 y.o. female with medical history significant for iron deficiency anemia secondary to GYN bleeding who presents for a follow up visit.  After review of the labs, review of the records, and discussion with the patient the patients findings are most consistent with iron deficiency anemia secondary to GYN bleeding.  There is also concern for possible absorptive disorder and she is being evaluated by gastroenterology later today.   # Iron Deficiency Anemia 2/2 to GYN Bleeding -- Findings are consistent with iron deficiency anemia secondary to patient's menstrual bleeding  --Encouraged her to follow-up with OB/GYN for control of menstrual cycles.  --HOLD ferrous sulfate as this has not effectively increased her iron and hemoglobin levels. --We will plan to proceed with IV iron therapy in order to help bolster the patient's blood counts if she is found to be iron deficient again in the future. --Labs today show white blood cell count 5.3, hemoglobin 14.3, MCV 82.1, and platelets of 244 --Patient currently following with gastroenterology for her stomach upset. --Plan for return to clinic in 6 months time for labs and 12 months for labs/clinic visit   No orders of the defined types were placed in this encounter.   All questions were answered. The patient knows to call the clinic with any problems, questions or concerns.  A total of more than 25 minutes were spent on this encounter with face-to-face time  and non-face-to-face time, including preparing to see the patient, ordering tests and/or medications, counseling the patient and coordination of care as outlined above.   Rogerio Clay, MD Department of Hematology/Oncology [facility] at [facility] Phone: [phone] Pager: [phone] Email: [email or web address]  08/13/2023 8:02 AM
